# Patient Record
Sex: Male | Born: 1970 | Race: Black or African American | Hispanic: No | Marital: Married
Health system: Midwestern US, Community
[De-identification: ages and names within clinical notes are randomized; demographics above are authoritative.]

## PROBLEM LIST (undated history)

## (undated) DIAGNOSIS — E785 Hyperlipidemia, unspecified: Secondary | ICD-10-CM

## (undated) DIAGNOSIS — C801 Malignant (primary) neoplasm, unspecified: Secondary | ICD-10-CM

## (undated) DIAGNOSIS — E119 Type 2 diabetes mellitus without complications: Secondary | ICD-10-CM

## (undated) DIAGNOSIS — R972 Elevated prostate specific antigen [PSA]: Secondary | ICD-10-CM

## (undated) DIAGNOSIS — T7840XA Allergy, unspecified, initial encounter: Secondary | ICD-10-CM

## (undated) DIAGNOSIS — I10 Essential (primary) hypertension: Secondary | ICD-10-CM

## (undated) DIAGNOSIS — B009 Herpesviral infection, unspecified: Secondary | ICD-10-CM

## (undated) HISTORY — DX: Allergy, unspecified, initial encounter: T78.40XA

## (undated) HISTORY — PX: OTHER SURGICAL HISTORY: SHX169

## (undated) HISTORY — DX: Type 2 diabetes mellitus without complications: E11.9

## (undated) HISTORY — PX: COLONOSCOPY: SHX174

## (undated) HISTORY — DX: Herpesviral infection, unspecified: B00.9

## (undated) HISTORY — DX: Hyperlipidemia, unspecified: E78.5

## (undated) HISTORY — PX: PROSTATE BIOPSY: SHX241

## (undated) HISTORY — DX: Essential (primary) hypertension: I10

---

## 2001-03-12 ENCOUNTER — Emergency Department (HOSPITAL_COMMUNITY): Admission: EM | Admit: 2001-03-12 | Discharge: 2001-03-12 | Payer: Self-pay | Admitting: Emergency Medicine

## 2002-03-05 ENCOUNTER — Encounter: Admission: RE | Admit: 2002-03-05 | Discharge: 2002-03-05 | Payer: Self-pay | Admitting: Emergency Medicine

## 2002-03-05 ENCOUNTER — Encounter: Payer: Self-pay | Admitting: Emergency Medicine

## 2008-09-15 ENCOUNTER — Emergency Department (HOSPITAL_COMMUNITY): Admission: EM | Admit: 2008-09-15 | Discharge: 2008-09-15 | Payer: Self-pay | Admitting: Emergency Medicine

## 2011-06-06 ENCOUNTER — Emergency Department (HOSPITAL_COMMUNITY)
Admission: EM | Admit: 2011-06-06 | Discharge: 2011-06-06 | Disposition: A | Discharge status: Home / Self Care | Payer: BC Managed Care – PPO | Attending: Emergency Medicine | Admitting: Emergency Medicine

## 2011-06-06 ENCOUNTER — Emergency Department (HOSPITAL_COMMUNITY): Payer: BC Managed Care – PPO

## 2011-06-06 DIAGNOSIS — K5900 Constipation, unspecified: Secondary | ICD-10-CM | POA: Insufficient documentation

## 2011-06-06 DIAGNOSIS — R109 Unspecified abdominal pain: Secondary | ICD-10-CM | POA: Insufficient documentation

## 2011-09-24 LAB — URINALYSIS, ROUTINE W REFLEX MICROSCOPIC
Glucose, UA: NEGATIVE
Protein, ur: NEGATIVE

## 2011-09-24 LAB — POCT I-STAT, CHEM 8
BUN: 8
Calcium, Ion: 1.1 — ABNORMAL LOW
Chloride: 106
Creatinine, Ser: 1.2
Glucose, Bld: 80
HCT: 46
Hemoglobin: 15.6
Potassium: 3.6
Sodium: 138
TCO2: 26

## 2011-09-24 LAB — COMPREHENSIVE METABOLIC PANEL
ALT: 43
AST: 51 — ABNORMAL HIGH
Albumin: 4.3
Alkaline Phosphatase: 67
Chloride: 105
GFR calc Af Amer: >60
Potassium: 4.1
Sodium: 138
Total Bilirubin: 1.7 — ABNORMAL HIGH

## 2011-09-24 LAB — DIFFERENTIAL
Basophils Absolute: 0
Basophils Relative: 0
Eosinophils Absolute: 0.1
Eosinophils Relative: 2
Lymphocytes Relative: 40
Lymphs Abs: 2.2
Monocytes Absolute: 0.4
Monocytes Relative: 7
Neutro Abs: 2.8
Neutrophils Relative %: 51

## 2011-09-24 LAB — CBC
HCT: 46.5
Hemoglobin: 16.1
MCHC: 34.6
MCV: 86
Platelets: 277
RBC: 5.4
RDW: 13.1
WBC: 5.5

## 2011-09-24 LAB — POCT CARDIAC MARKERS: Myoglobin, poc: 66.8

## 2012-09-09 LAB — HM COLONOSCOPY: HM Colonoscopy: NORMAL

## 2013-03-30 LAB — LIPID PANEL
Cholesterol: 206 mg/dL — AB (ref 0–200)
HDL: 39 mg/dL (ref 35–70)
LDL CALC: 126 mg/dL
TRIGLYCERIDES: 259 mg/dL — AB (ref 40–160)

## 2013-03-30 LAB — CBC AND DIFFERENTIAL: HEMOGLOBIN: 16.5 g/dL (ref 13.5–17.5)

## 2013-03-30 LAB — BASIC METABOLIC PANEL
BUN: 9 mg/dL (ref 4–21)
CREATININE: 0.9 mg/dL (ref <=1.3)
Glucose: 86 mg/dL
POTASSIUM: 3.8 mmol/L (ref 3.4–5.3)
Sodium: 138 mmol/L (ref 137–147)

## 2014-07-11 LAB — CBC WITH AUTOMATED DIFF
ABS. BASOPHILS: 0 10*3/uL (ref 0.0–0.06)
ABS. EOSINOPHILS: 0.2 10*3/uL (ref 0.0–0.4)
ABS. LYMPHOCYTES: 2.4 10*3/uL (ref 0.9–3.6)
ABS. MONOCYTES: 0.4 10*3/uL (ref 0.05–1.2)
ABS. NEUTROPHILS: 1.9 10*3/uL (ref 1.8–8.0)
BASOPHILS: 0 % (ref 0–2)
EOSINOPHILS: 3 % (ref 0–5)
HCT: 45.2 % (ref 36.0–48.0)
HGB: 16.4 g/dL — ABNORMAL HIGH (ref 13.0–16.0)
LYMPHOCYTES: 49 % (ref 21–52)
MCH: 29.7 PG (ref 24.0–34.0)
MCHC: 36.3 g/dL (ref 31.0–37.0)
MCV: 81.9 FL (ref 74.0–97.0)
MONOCYTES: 9 % (ref 3–10)
MPV: 10.6 FL (ref 9.2–11.8)
NEUTROPHILS: 39 % — ABNORMAL LOW (ref 40–73)
PLATELET: 241 10*3/uL (ref 135–420)
RBC: 5.52 M/uL — ABNORMAL HIGH (ref 4.70–5.50)
RDW: 12.7 % (ref 11.6–14.5)
WBC: 4.8 10*3/uL (ref 4.6–13.2)

## 2014-07-11 LAB — EKG, 12 LEAD, INITIAL
Atrial Rate: 75 {beats}/min
Calculated P Axis: 53 degrees
Calculated R Axis: 1 degrees
Calculated T Axis: 5 degrees
P-R Interval: 210 ms
Q-T Interval: 402 ms
QRS Duration: 112 ms
QTC Calculation (Bezet): 448 ms
Ventricular Rate: 75 {beats}/min

## 2014-07-11 LAB — URINALYSIS W/ RFLX MICROSCOPIC
Bilirubin: NEGATIVE
Blood: NEGATIVE
Glucose: NEGATIVE mg/dL
Ketone: NEGATIVE mg/dL
Leukocyte Esterase: NEGATIVE
Nitrites: NEGATIVE
Protein: NEGATIVE mg/dL
Specific gravity: 1.015 (ref 1.003–1.030)
Urobilinogen: 0.2 EU/dL (ref 0.2–1.0)
pH (UA): 6 (ref 5.0–8.0)

## 2014-07-11 LAB — METABOLIC PANEL, BASIC
Anion gap: 7 mmol/L (ref 3.0–18)
BUN/Creatinine ratio: 11 — ABNORMAL LOW (ref 12–20)
BUN: 12 MG/DL (ref 7.0–18)
CO2: 29 mmol/L (ref 21–32)
Calcium: 8.9 MG/DL (ref 8.5–10.1)
Chloride: 103 mmol/L (ref 100–108)
Creatinine: 1.09 MG/DL (ref 0.6–1.3)
GFR est AA: >60 mL/min/{1.73_m2} (ref >60)
GFR est non-AA: >60 mL/min/{1.73_m2} (ref >60)
Glucose: 118 mg/dL — ABNORMAL HIGH (ref 74–99)
Potassium: 4.1 mmol/L (ref 3.5–5.5)
Sodium: 139 mmol/L (ref 136–145)

## 2014-07-11 LAB — GLUCOSE, POC: Glucose (POC): 95 mg/dL (ref 70–110)

## 2014-07-11 MED ADMIN — sodium chloride 0.9 % bolus infusion 1,000 mL: INTRAVENOUS | @ 18:00:00 | NDC 00409798309

## 2014-07-11 MED FILL — SODIUM CHLORIDE 0.9 % IV: INTRAVENOUS | Qty: 1000

## 2014-07-11 NOTE — ED Notes (Signed)
Stood up light headed in parking lot. Took wifes lisinopril because he didn't have hismed

## 2014-07-11 NOTE — ED Notes (Signed)
Pt states he did not feel any dizziness during orthostatic BP checks.

## 2014-07-11 NOTE — ED Provider Notes (Signed)
HPI Comments: Larry Baird is a 43 y.o. male with a history of HTN, who presents to the ED c/o lightheadedness and near syncope which began about 20 minutes PTA. Patient states that he started feeling lightheaded and dizzy upon getting out of the car while walking into a restaurant. He describes his diziness as "a heavy head" with associated lightheadedness and "my vision started to go dark and I felt like I might pass out". Patient denies "room-spinning" dizziness, syncope, headache, gait problems, numbness, weakness nausea, and vomiting. He states that he has been on vacation this weekend and spent all day yesterday on the beach, "drinking a few beers, eating out and having a good time". No other symptoms or complaints were presented at this time.      The history is provided by the patient.        Past Medical History   Diagnosis Date   ??? Hypertension         History reviewed. No pertinent past surgical history.      History reviewed. No pertinent family history.     History     Social History   ??? Marital Status: N/A     Spouse Name: N/A     Number of Children: N/A   ??? Years of Education: N/A     Occupational History   ??? Not on file.     Social History Main Topics   ??? Smoking status: Never Smoker    ??? Smokeless tobacco: Not on file   ??? Alcohol Use: No   ??? Drug Use: Not on file   ??? Sexual Activity: Not on file     Other Topics Concern   ??? Not on file     Social History Narrative   ??? No narrative on file                  ALLERGIES: Review of patient's allergies indicates no known allergies.      Review of Systems   Constitutional: Negative.    HENT: Negative.    Eyes: Positive for visual disturbance (blurred, associated with lightheadedness).   Respiratory: Negative.    Cardiovascular: Negative.    Gastrointestinal: Negative.  Negative for nausea and vomiting.   Endocrine: Negative.    Genitourinary: Negative.    Musculoskeletal: Negative.  Negative for gait problem.   Skin: Negative.     Allergic/Immunologic: Negative.    Neurological: Positive for dizziness and light-headedness. Negative for syncope and headaches.   Hematological: Negative.    Psychiatric/Behavioral: Negative.    All other systems reviewed and are negative.      Filed Vitals:    07/11/14 1237   BP: 146/92   Pulse: 77   Temp: 97.4 ??F (36.3 ??C)   Resp: 17   Height: 5\' 9"  (1.753 m)   Weight: 92.987 kg (205 lb)   SpO2: 100%            Physical Exam   Constitutional: He is oriented to person, place, and time. He appears well-developed and well-nourished. No distress.   Resting comfortably on stretcher   HENT:   Head: Normocephalic and atraumatic.   Mouth/Throat: Oropharynx is clear and moist. No oropharyngeal exudate.   MM moist   Eyes: Conjunctivae and EOM are normal. Pupils are equal, round, and reactive to light. No scleral icterus.   Sclera clear bilaterally   Neck: Normal range of motion. Neck supple. No JVD present. No thyromegaly present.   Non-tender to palpation.  No carotid bruits auscultated.   Cardiovascular: Normal rate, regular rhythm and normal heart sounds.  Exam reveals no gallop and no friction rub.    No murmur heard.  Pulmonary/Chest: Effort normal and breath sounds normal. No respiratory distress. He has no wheezes. He has no rales. He exhibits no tenderness.   No crepitance with palpation   Abdominal: Soft. He exhibits no distension. There is no tenderness.   Genitourinary:   No CVA tenderness   Musculoskeletal: He exhibits no edema or tenderness.   Normal inspection of upper extremities.  No edema noted to bilateral lower extremities   Lymphadenopathy:     He has no cervical adenopathy.   Neurological: He is alert and oriented to person, place, and time. No cranial nerve deficit. He exhibits normal muscle tone.   Normal motor and sensation bilaterally to upper and lower extremities   Skin: Skin is warm and dry. No rash noted. He is not diaphoretic.   Psychiatric:   Normal mood and affect.     Vitals reviewed.        MDM  Number of Diagnoses or Management Options  Diagnosis management comments: Pt with lightheadedness and near syncope upon standing after weekend on the beach.  Some drinking yesterday.  Non-focal neuro exam and no associated cardiopulmonary symptoms.  Will check labs and EKG, orthostatics but suspect relative intravascular volume depletion.  Reassess.    Pt states he is feeling much better.  Has ambulated around ED without dizziness or difficulty.  Will discharge to home. Advised rest, PO fluids, avoid heat and follow up with PMD in [place] when he returns home today.  Pt and family in agreement with discharge plans.       Amount and/or Complexity of Data Reviewed  Clinical lab tests: ordered and reviewed  Tests in the radiology section of CPT??: reviewed and ordered  Tests in the medicine section of CPT??: ordered and reviewed  Decide to obtain previous medical records or to obtain history from someone other than the patient: yes  Obtain history from someone other than the patient: yes  Independent visualization of images, tracings, or specimens: yes    Risk of Complications, Morbidity, and/or Mortality  Presenting problems: moderate  Management options: moderate    Patient Progress  Patient progress: improved      Procedures    EKG:  NSR, rate 75, normal axis, no ST-T abnormalities.  No old EKG for comparison.    Scribe Attestation Statement:   Curt JewsKatie Shematek 07/11/2014 1:05 PM scribing for and in the presence of Dr. Rudell Cobbarty E Mouna Yager, MD     [facility]atie Shematek, Scribe      I personally performed the services described in the documentation, reviewed the documentation, as recorded by the scribe in my presence, and it accurately and completely records my words and actions.  Rudell CobbARTY E Viva Gallaher, MD

## 2014-07-11 NOTE — ED Notes (Signed)
Dr in the room to see pt.

## 2014-07-11 NOTE — ED Notes (Signed)
Patient armband removed and shredded  I have reviewed discharge instructions with the patient.  The patient verbalized understanding.  Patient discharged on no new prescriptions.

## 2014-07-11 NOTE — ED Notes (Signed)
Pt a/ox4, plan of care discussed and questions encouraged. Will continue to monitor and support. Call bell within reach.

## 2014-07-11 NOTE — ED Notes (Addendum)
Pt states he is feeling dizzy since he took his wife's lisinopril this morning. Pt denies cp, sob, HA, numbness, weakness, N/V/D, abdominal pain and any changes in bowel and bladder habits.

## 2014-09-09 ENCOUNTER — Encounter: Payer: Self-pay | Admitting: Family Medicine

## 2014-09-09 DIAGNOSIS — A6000 Herpesviral infection of urogenital system, unspecified: Secondary | ICD-10-CM | POA: Insufficient documentation

## 2014-09-09 DIAGNOSIS — I10 Essential (primary) hypertension: Secondary | ICD-10-CM | POA: Insufficient documentation

## 2014-09-09 DIAGNOSIS — B009 Herpesviral infection, unspecified: Secondary | ICD-10-CM

## 2014-09-09 DIAGNOSIS — I159 Secondary hypertension, unspecified: Secondary | ICD-10-CM

## 2014-09-30 ENCOUNTER — Encounter: Payer: Self-pay | Admitting: Family Medicine

## 2014-09-30 ENCOUNTER — Ambulatory Visit (INDEPENDENT_AMBULATORY_CARE_PROVIDER_SITE_OTHER): Payer: BC Managed Care – PPO | Admitting: Family Medicine

## 2014-09-30 VITALS — BP 130/98 | HR 80 | Temp 98.2°F | Ht 69.0 in | Wt 214.0 lb

## 2014-09-30 DIAGNOSIS — B009 Herpesviral infection, unspecified: Secondary | ICD-10-CM

## 2014-09-30 DIAGNOSIS — I10 Essential (primary) hypertension: Secondary | ICD-10-CM

## 2014-09-30 MED ORDER — LISINOPRIL 20 MG PO TABS: 20.0000 mg | ORAL_TABLET | Freq: Every day | ORAL | Status: DC

## 2014-09-30 MED ORDER — VALACYCLOVIR HCL 500 MG PO TABS: 500.0000 mg | ORAL_TABLET | Freq: Two times a day (BID) | ORAL | Status: DC

## 2014-09-30 NOTE — Assessment & Plan Note (Signed)
Poor control. Increase lisinopril to 20mg  from 5mg . Start DASH diet and continue recent start of exercise.

## 2014-09-30 NOTE — Assessment & Plan Note (Signed)
Refilled valtrex for prn use  

## 2014-09-30 NOTE — Patient Instructions (Addendum)
Flu shot today  Stop at front desk with medical records wo we can get the following: Records-immunizations, labs for last 5 years, colonoscopy report, last physical exam  Blood pressure is elevated on the bottom number. I am increasing your lisinopril to 20mg . We will get labs to make sure kidney function was ok through your records.   You set a weight goal of 180-190. This should help with your blood pressure. Keep up the exercise and follow the eating plan below to help you get to blood pressure goal without more medicine.   DASH Eating Plan DASH stands for "Dietary Approaches to Stop Hypertension." The DASH eating plan is a healthy eating plan that has been shown to reduce high blood pressure (hypertension). Additional health benefits may include reducing the risk of type 2 diabetes mellitus, heart disease, and stroke. The DASH eating plan may also help with weight loss. WHAT DO I NEED TO KNOW ABOUT THE DASH EATING PLAN? For the DASH eating plan, you will follow these general guidelines:  Choose foods with a percent daily value for sodium of less than 5% (as listed on the food label).  Use salt-free seasonings or herbs instead of table salt or sea salt.  Check with your health care provider or pharmacist before using salt substitutes.  Eat lower-sodium products, often labeled as "lower sodium" or "no salt added."  Eat fresh foods.  Eat more vegetables, fruits, and low-fat dairy products.  Choose whole grains. Look for the word "whole" as the first word in the ingredient list.  Choose fish and skinless chicken or [place]key more often than red meat. Limit fish, poultry, and meat to 6 oz (170 g) each day.  Limit sweets, desserts, sugars, and sugary drinks.  Choose heart-healthy fats.  Limit cheese to 1 oz (28 g) per day.  Eat more home-cooked food and less restaurant, buffet, and fast food.  Limit fried foods.  Cook foods using methods other than frying.  Limit canned  vegetables. If you do use them, rinse them well to decrease the sodium.  When eating at a restaurant, ask that your food be prepared with less salt, or no salt if possible. WHAT FOODS CAN I EAT? Seek help from a dietitian for individual calorie needs. Grains Whole grain or whole wheat bread. Brown rice. Whole grain or whole wheat pasta. Quinoa, bulgur, and whole grain cereals. Low-sodium cereals. Corn or whole wheat flour tortillas. Whole grain cornbread. Whole grain crackers. Low-sodium crackers. Vegetables Fresh or frozen vegetables (raw, steamed, roasted, or grilled). Low-sodium or reduced-sodium tomato and vegetable juices. Low-sodium or reduced-sodium tomato sauce and paste. Low-sodium or reduced-sodium canned vegetables.  Fruits All fresh, canned (in natural juice), or frozen fruits. Meat and Other Protein Products Ground beef (85% or leaner), grass-fed beef, or beef trimmed of fat. Skinless chicken or [place]key. Ground chicken or [place]key. Pork trimmed of fat. All fish and seafood. Eggs. Dried beans, peas, or lentils. Unsalted nuts and seeds. Unsalted canned beans. Dairy Low-fat dairy products, such as skim or 1% milk, 2% or reduced-fat cheeses, low-fat ricotta or cottage cheese, or plain low-fat yogurt. Low-sodium or reduced-sodium cheeses. Fats and Oils Tub margarines without trans fats. Light or reduced-fat mayonnaise and salad dressings (reduced sodium). Avocado. Safflower, olive, or canola oils. Natural peanut or almond butter. Other Unsalted popcorn and pretzels. The items listed above may not be a complete list of recommended foods or beverages. Contact your dietitian for more options. WHAT FOODS ARE NOT RECOMMENDED? Grains White bread. White  pasta. White rice. Refined cornbread. Bagels and croissants. Crackers that contain trans fat. Vegetables Creamed or fried vegetables. Vegetables in a cheese sauce. Regular canned vegetables. Regular canned tomato sauce and paste. Regular tomato  and vegetable juices. Fruits Dried fruits. Canned fruit in light or heavy syrup. Fruit juice. Meat and Other Protein Products Fatty cuts of meat. Ribs, chicken wings, bacon, sausage, bologna, salami, chitterlings, fatback, hot dogs, bratwurst, and packaged luncheon meats. Salted nuts and seeds. Canned beans with salt. Dairy Whole or 2% milk, cream, half-and-half, and cream cheese. Whole-fat or sweetened yogurt. Full-fat cheeses or blue cheese. Nondairy creamers and whipped toppings. Processed cheese, cheese spreads, or cheese curds. Condiments Onion and garlic salt, seasoned salt, table salt, and sea salt. Canned and packaged gravies. Worcestershire sauce. Tartar sauce. Barbecue sauce. Teriyaki sauce. Soy sauce, including reduced sodium. Steak sauce. Fish sauce. Oyster sauce. Cocktail sauce. Horseradish. Ketchup and mustard. Meat flavorings and tenderizers. Bouillon cubes. Hot sauce. Tabasco sauce. Marinades. Taco seasonings. Relishes. Fats and Oils Butter, stick margarine, lard, shortening, ghee, and bacon fat. Coconut, palm kernel, or palm oils. Regular salad dressings. Other Pickles and olives. Salted popcorn and pretzels. The items listed above may not be a complete list of foods and beverages to avoid. Contact your dietitian for more information. WHERE CAN I FIND MORE INFORMATION? National Heart, Lung, and Blood Institute: [employer] Document Released: 11/29/2011 Document Revised: 04/26/2014 Document Reviewed: 10/14/2013 [facility] Patient Information 2015 [place], [place]. This information is not intended to replace advice given to you by your health care provider. Make sure you discuss any questions you have with your health care provider.

## 2014-09-30 NOTE — Progress Notes (Signed)
Tana Conch, MD Phone: [phone]  Subjective:  Patient presents today to establish care. Chief complaint-noted.   Hypertension-poor control  BP Readings from Last 3 Encounters:  09/30/14 130/98  Started working out about a week ago. Cardio 20-30 minutes on elliptical or treadmill and then weight bench trying to do 3 days a week.  Foods-does eat out a lot, tea 2x a week Taking lisinopril 5mg  Compliant with medications-yes without side effects ROS-Denies any CP, HA, SOB, blurry vision, LE edema.   Genital Herpes- well controlled Valtrex once per year for flares but no suppressive use.  ROS-no current liesions noted  The following were reviewed and entered/updated in epic: Past Medical History  Diagnosis Date  . Hypertension   . Herpes    Patient Active Problem List   Diagnosis Date Noted  . Hypertension 09/09/2014  . Herpes 09/09/2014   Past Surgical History  Procedure Laterality Date  . None      Family History  Problem Relation Age of Onset  . Diabetes Mother   . Kidney disease Brother     transplant    Medications- reviewed and updated Current Outpatient Prescriptions  Medication Sig Dispense Refill  . lisinopril (PRINIVIL,ZESTRIL) 20 MG tablet Take 1 tablet (20 mg total) by mouth daily.  90 tablet  3  . valACYclovir (VALTREX) 500 MG tablet Take 1 tablet (500 mg total) by mouth 2 (two) times daily.  6 tablet  5   No current facility-administered medications for this visit.    Allergies-reviewed and updated No Known Allergies  History   Social History  . Marital Status: Married    Spouse Name: N/A    Number of Children: N/A  . Years of Education: N/A   Social History Main Topics  . Smoking status: Never Smoker   . Smokeless tobacco: Not on file  . Alcohol Use: 0.5 oz/week    1 drink(s) per week     Comment: max 3.   . Drug Use: No  . Sexual Activity: Yes   Other Topics Concern  . Not on file   Social History Narrative   Married 2005  to [employer]. 1 son who is at [employer], [employer].       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    BA from [employer].       Hobbies: gamer-[employer] of [employer], [employer], [employer] 2    ROS--See HPI , otherwise full ROS was completed and negative except as noted above  Objective: BP 130/98  Pulse 80  Temp(Src) 98.2 F (36.8 C)  Ht 5\' 9"  (1.753 m)  Wt 214 lb (97.07 kg)  BMI 31.59 kg/m2 Gen: NAD, resting comfortably on table HEENT: Mucous membranes are moist. Oropharynx normal. Tm normal . EYEs: no lid lag, sclera and conjunctivae normal Neck: no thyromegaly CV: RRR no murmurs rubs or gallops Lungs: CTAB no crackles, wheeze, rhonchi Abdomen: soft/nontender/nondistended/normal bowel sounds. No rebound or guarding.  Ext: no edema, 2+ PT pulses Skin: warm, dry, no rash Neuro: grossly normal, moves all extremities, PERRLA   Assessment/Plan:  Obtain Records-immunizations, labs for last 5 years, colonoscopy report, last physical exam  Hypertension Poor control. Increase lisinopril to 20mg  from 5mg . Start DASH diet and continue recent start of exercise.   Herpes Refilled valtrex for prn use  f/u 6 months-patient to work on weight/diet/exercise in hopes it helps BP  Meds ordered this encounter  Medications  . DISCONTD: lisinopril (PRINIVIL,ZESTRIL) 5 MG tablet    Sig: Take 5  mg by mouth daily.  . valACYclovir (VALTREX) 500 MG tablet    Sig: Take 1 tablet (500 mg total) by mouth 2 (two) times daily.    Dispense:  6 tablet    Refill:  5  . lisinopril (PRINIVIL,ZESTRIL) 20 MG tablet    Sig: Take 1 tablet (20 mg total) by mouth daily.    Dispense:  90 tablet    Refill:  3

## 2015-04-01 ENCOUNTER — Ambulatory Visit: Payer: BC Managed Care – PPO | Admitting: Family Medicine

## 2015-04-13 ENCOUNTER — Emergency Department (HOSPITAL_BASED_OUTPATIENT_CLINIC_OR_DEPARTMENT_OTHER)
Admission: EM | Admit: 2015-04-13 | Discharge: 2015-04-14 | Disposition: A | Discharge status: Home / Self Care | Payer: BLUE CROSS/BLUE SHIELD | Attending: Emergency Medicine | Admitting: Emergency Medicine

## 2015-04-13 ENCOUNTER — Encounter (HOSPITAL_BASED_OUTPATIENT_CLINIC_OR_DEPARTMENT_OTHER): Payer: Self-pay

## 2015-04-13 ENCOUNTER — Emergency Department (HOSPITAL_BASED_OUTPATIENT_CLINIC_OR_DEPARTMENT_OTHER): Payer: BLUE CROSS/BLUE SHIELD

## 2015-04-13 DIAGNOSIS — R42 Dizziness and giddiness: Secondary | ICD-10-CM | POA: Diagnosis not present

## 2015-04-13 DIAGNOSIS — R112 Nausea with vomiting, unspecified: Secondary | ICD-10-CM | POA: Insufficient documentation

## 2015-04-13 DIAGNOSIS — I10 Essential (primary) hypertension: Secondary | ICD-10-CM | POA: Insufficient documentation

## 2015-04-13 LAB — COMPREHENSIVE METABOLIC PANEL
ALK PHOS: 55 U/L (ref 39–117)
ALT: 38 U/L (ref 0–53)
AST: 26 U/L (ref 0–37)
Albumin: 4.2 g/dL (ref 3.5–5.2)
Anion gap: 7 (ref 5–15)
BUN: 14 mg/dL (ref 6–23)
CO2: 28 mmol/L (ref 19–32)
Calcium: 8.9 mg/dL (ref 8.4–10.5)
Chloride: 104 mmol/L (ref 96–112)
Creatinine, Ser: 0.86 mg/dL (ref 0.50–1.35)
GFR calc non Af Amer: >90 mL/min (ref >90)
GLUCOSE: 144 mg/dL — AB (ref 70–99)
POTASSIUM: 4 mmol/L (ref 3.5–5.1)
SODIUM: 139 mmol/L (ref 135–145)
TOTAL PROTEIN: 7.6 g/dL (ref 6.0–8.3)
Total Bilirubin: 1.3 mg/dL — ABNORMAL HIGH (ref 0.3–1.2)

## 2015-04-13 LAB — CBC WITH DIFFERENTIAL/PLATELET
BASOS ABS: 0 10*3/uL (ref 0.0–0.1)
BASOS PCT: 0 % (ref 0–1)
Eosinophils Absolute: 0 10*3/uL (ref 0.0–0.7)
Eosinophils Relative: 1 % (ref 0–5)
HEMATOCRIT: 43.7 % (ref 39.0–52.0)
Hemoglobin: 15.6 g/dL (ref 13.0–17.0)
LYMPHS PCT: 15 % (ref 12–46)
Lymphs Abs: 1.3 10*3/uL (ref 0.7–4.0)
MCH: 29 pg (ref 26.0–34.0)
MCHC: 35.7 g/dL (ref 30.0–36.0)
MCV: 81.2 fL (ref 78.0–100.0)
Monocytes Absolute: 0.6 10*3/uL (ref 0.1–1.0)
Monocytes Relative: 7 % (ref 3–12)
NEUTROS ABS: 6.7 10*3/uL (ref 1.7–7.7)
NEUTROS PCT: 77 % (ref 43–77)
PLATELETS: 272 10*3/uL (ref 150–400)
RBC: 5.38 MIL/uL (ref 4.22–5.81)
RDW: 13.1 % (ref 11.5–15.5)
WBC: 8.7 10*3/uL (ref 4.0–10.5)

## 2015-04-13 MED ORDER — DIAZEPAM 5 MG PO TABS
5.0000 mg | ORAL_TABLET | Freq: Once | ORAL | Status: AC
  Administered 2015-04-13: 5 mg via ORAL
  Filled 2015-04-13: qty 1

## 2015-04-13 MED ORDER — LORAZEPAM 2 MG/ML IJ SOLN
1.0000 mg | Freq: Once | INTRAMUSCULAR | Status: AC
  Administered 2015-04-13: 1 mg via INTRAVENOUS
  Filled 2015-04-13: qty 1

## 2015-04-13 MED ORDER — MECLIZINE HCL 25 MG PO TABS
25.0000 mg | ORAL_TABLET | Freq: Once | ORAL | Status: AC
  Administered 2015-04-13: 25 mg via ORAL
  Filled 2015-04-13: qty 1

## 2015-04-13 MED ORDER — DIAZEPAM 5 MG/ML IJ SOLN
2.5000 mg | Freq: Once | INTRAMUSCULAR | Status: DC
  Filled 2015-04-13: qty 2

## 2015-04-13 MED ORDER — ONDANSETRON 4 MG PO TBDP
4.0000 mg | ORAL_TABLET | Freq: Once | ORAL | Status: AC
  Administered 2015-04-13: 4 mg via ORAL
  Filled 2015-04-13: qty 1

## 2015-04-13 MED ORDER — SODIUM CHLORIDE 0.9 % IV BOLUS (SEPSIS)
1000.0000 mL | Freq: Once | INTRAVENOUS | Status: AC
  Administered 2015-04-13: 1000 mL via INTRAVENOUS

## 2015-04-13 NOTE — ED Notes (Signed)
Drew up IV Valium with Vinson MoselleSam Coble, RN and got to room to find that pt had pulled out his IV and no longer has IV access.  MD notified.  VO to change order from Valium 2.5mg  IV to Valium 5mg  PO.

## 2015-04-13 NOTE — ED Notes (Signed)
Pt reports approx 1 hour ago started having dizziness described as room spinning.  Reports unable to hold anything down.  Reports vomited x4.

## 2015-04-13 NOTE — ED Notes (Signed)
Pt c/o dizziness onset 1.5 hours ago  Room spinning,  N/v, little bit of diarrhea

## 2015-04-13 NOTE — ED Provider Notes (Signed)
CSN: [id number]     Arrival date & time 04/13/15  1926 History  This chart was scribed for Tilden Fossa, MD by Phillis Haggis, ED Scribe. This patient was seen in room MH05/MH05 and patient care was started at 8:56 PM.      Chief Complaint  Patient presents with  . Dizziness   Patient is a 44 y.o. male presenting with dizziness. The history is provided by the patient.  Dizziness Quality:  Head spinning Severity:  Moderate Duration:  2 hours Timing:  Constant Progression:  Worsening Chronicity:  New Context: eye movement and head movement   Associated symptoms: nausea and vomiting   Associated symptoms: no chest pain and no weakness     HPI Comments: [name] [name] is a 44 y.o. male with a history of HTN who presents to the Emergency Department complaining of dizziness onset two hours ago. He states that quick movements make the dizziness worse. He describes the the dizziness as the room spinning. He reports associated vomiting and that he cannot keep anything down.  He denies injury or strenuous activity at the time of the incident. Patient denies fever, numbness, weakness, chest or abdominal pain. He states that he had a similar episode last summer and was told it was dehydration. He reports a family history of diabetes. Patient denies any other medical problems. He denies drinking any alcohol. Symptoms are severe, constant.  Past Medical History  Diagnosis Date  . Hypertension   . Herpes    Past Surgical History  Procedure Laterality Date  . None     Family History  Problem Relation Age of Onset  . Diabetes Mother   . Kidney disease Brother     transplant   History  Substance Use Topics  . Smoking status: Never Smoker   . Smokeless tobacco: Not on file  . Alcohol Use: 0.5 oz/week    1 drink(s) per week     Comment: max 3.     Review of Systems  Constitutional: Negative for fever.  Cardiovascular: Negative for chest pain.  Gastrointestinal: Positive for nausea  and vomiting. Negative for abdominal pain.  Neurological: Positive for dizziness. Negative for weakness and numbness.  All other systems reviewed and are negative.   Allergies  Review of patient's allergies indicates no known allergies.  Home Medications   Prior to Admission medications   Medication Sig Start Date End Date Taking? Authorizing Provider  lisinopril (PRINIVIL,ZESTRIL) 20 MG tablet Take 1 tablet (20 mg total) by mouth daily. 09/30/14   Shelva Majestic, MD  valACYclovir (VALTREX) 500 MG tablet Take 1 tablet (500 mg total) by mouth 2 (two) times daily. 09/30/14   Shelva Majestic, MD   BP 173/95 mmHg  Pulse 81  Temp(Src) 98.1 F (36.7 C) (Oral)  Resp 18  Ht  (1.727 m)  Wt 205 lb (92.987 kg)  BMI 31.18 kg/m2  SpO2 98%   Physical Exam  Constitutional: He is oriented to person, place, and time. He appears well-developed and well-nourished.  HENT:  Head: Normocephalic and atraumatic.  Eyes: Pupils are equal, round, and reactive to light.  Nystagmus on gaze to right  Cardiovascular: Normal rate and regular rhythm.   No murmur heard. Pulmonary/Chest: Effort normal and breath sounds normal. No respiratory distress.  Abdominal: Soft. There is no tenderness. There is no rebound and no guarding.  Musculoskeletal: He exhibits no edema or tenderness.  Neurological: He is alert and oriented to person, place, and time. No cranial nerve  deficit.  5/5 strength in all four extremities  Skin: Skin is warm and dry.  Psychiatric: He has a normal mood and affect. His behavior is normal.  Nursing note and vitals reviewed.   ED Course  Procedures (including critical care time) DIAGNOSTIC STUDIES: Oxygen Saturation is 98% on room air, normal by my interpretation.    COORDINATION OF CARE: 9:00 PM-Discussed treatment plan which includes fluids and labs with pt at bedside and pt agreed to plan.   Labs Review Labs Reviewed  COMPREHENSIVE METABOLIC PANEL - Abnormal; Notable for  the following:    Glucose, Bld 144 (*)    Total Bilirubin 1.3 (*)    All other components within normal limits  CBC WITH DIFFERENTIAL/PLATELET    Imaging Review Ct Head Wo Contrast  04/13/2015   CLINICAL DATA:  Dizziness, 5 hours duration.  Nausea and vomiting.  EXAM: CT HEAD WITHOUT CONTRAST  TECHNIQUE: Contiguous axial images were obtained from the base of the skull through the vertex without intravenous contrast.  COMPARISON:  None.  FINDINGS: The brain has a normal appearance without evidence of atrophy, infarction, mass lesion, hemorrhage, hydrocephalus or extra-axial collection. The calvarium is unremarkable. The paranasal sinuses, middle ears and mastoids are clear except for a very tiny amount of fluid in the mastoid air cells on the left, probably subclinical.  IMPRESSION: Normal   Electronically Signed   By: Paulina FusiMark  Shogry M.D.   On: 04/13/2015 23:24     EKG Interpretation   Date/Time:  Wednesday April 13 2015 21:23:06 EDT Ventricular Rate:  73 PR Interval:  214 QRS Duration: 112 QT Interval:  408 QTC Calculation: 449 R Axis:   50 Text Interpretation:  Sinus rhythm with 1st degree A-V block Otherwise  normal ECG Confirmed by Lincoln Brighamees, Liz [phone]([id number]) on 04/13/2015 9:26:42 PM      MDM   Final diagnoses:  Vertigo     Patient here for evaluation of vertigo. Patient persistently vertiginous in the emergency department. Vertigo does not change with position. Attempted Valium, Ativan, Zofran, meclizine without improvement in his symptoms. His vomiting has resolved in the ED. On repeat evaluation patient has persistent nystagmus that does not appear to extinguish. Plan to obtain an MRI to rule out posterior stroke given persistent vertigo. [facility] and he will see the patient in transfer.  I personally performed the services described in this documentation, which was scribed in my presence. The recorded information has been reviewed and is  accurate.    Tilden FossaElizabeth Malloree Raboin, MD 04/14/15 [phone]

## 2015-04-14 ENCOUNTER — Emergency Department (HOSPITAL_COMMUNITY): Payer: BLUE CROSS/BLUE SHIELD

## 2015-04-14 MED ORDER — MECLIZINE HCL 25 MG PO TABS: 25.0000 mg | ORAL_TABLET | Freq: Three times a day (TID) | ORAL | Status: DC | PRN

## 2015-04-14 MED ORDER — DIAZEPAM 5 MG PO TABS: 5.0000 mg | ORAL_TABLET | Freq: Three times a day (TID) | ORAL | Status: DC | PRN

## 2015-04-14 MED ORDER — ONDANSETRON 4 MG PO TBDP
8.0000 mg | ORAL_TABLET | Freq: Once | ORAL | Status: AC
  Administered 2015-04-14: 8 mg via ORAL
  Filled 2015-04-14: qty 2

## 2015-04-14 MED ORDER — SALINE SPRAY 0.65 % NA SOLN: 1.0000 | NASAL | Status: DC | PRN

## 2015-04-14 MED ORDER — SODIUM CHLORIDE 0.9 % IV BOLUS (SEPSIS)
1000.0000 mL | Freq: Once | INTRAVENOUS | Status: AC
  Administered 2015-04-14: 1000 mL via INTRAVENOUS

## 2015-04-14 MED ORDER — FLUTICASONE PROPIONATE 50 MCG/ACT NA SUSP: 1.0000 | Freq: Every day | NASAL | Status: DC

## 2015-04-14 NOTE — ED Notes (Signed)
Pt vomiting prior to leaving - verbal order for ODT zofran with readback from dr. Wilkie AyeHorton.

## 2015-04-14 NOTE — Discharge Instructions (Signed)
He presented today with dizziness. Your dizziness may be related to inner ear problems. Your MRI was negative for stroke.  You have some fluid in your in her ear as well as her sinuses. You'll be given nasal saline and Flonase in an attempt to drain your congestion. No indication for an approximate this time. Follow-up with her primary physician.  Dizziness Dizziness is a common problem. It is a feeling of unsteadiness or light-headedness. You may feel like you are about to faint. Dizziness can lead to injury if you stumble or fall. A person of any age group can suffer from dizziness, but dizziness is more common in older adults. CAUSES  Dizziness can be caused by many different things, including:  Middle ear problems.  Standing for too long.  Infections.  An allergic reaction.  Aging.  An emotional response to something, such as the sight of blood.  Side effects of medicines.  Tiredness.  Problems with circulation or blood pressure.  Excessive use of alcohol or medicines, or illegal drug use.  Breathing too fast (hyperventilation).  An irregular heart rhythm (arrhythmia).  A low red blood cell count (anemia).  Pregnancy.  Vomiting, diarrhea, fever, or other illnesses that cause body fluid loss (dehydration).  Diseases or conditions such as Parkinson's disease, high blood pressure (hypertension), diabetes, and thyroid problems.  Exposure to extreme heat. DIAGNOSIS  Your health care provider will ask about your symptoms, perform a physical exam, and perform an electrocardiogram (ECG) to record the electrical activity of your heart. Your health care provider may also perform other heart or blood tests to determine the cause of your dizziness. These may include:  Transthoracic echocardiogram (TTE). During echocardiography, sound waves are used to evaluate how blood flows through your heart.  Transesophageal echocardiogram (TEE).  Cardiac monitoring. This allows your health  care provider to monitor your heart rate and rhythm in real time.  Holter monitor. This is a portable device that records your heartbeat and can help diagnose heart arrhythmias. It allows your health care provider to track your heart activity for several days if needed.  Stress tests by exercise or by giving medicine that makes the heart beat faster. TREATMENT  Treatment of dizziness depends on the cause of your symptoms and can vary greatly. HOME CARE INSTRUCTIONS   Drink enough fluids to keep your urine clear or pale yellow. This is especially important in very hot weather. In older adults, it is also important in cold weather.  Take your medicine exactly as directed if your dizziness is caused by medicines. When taking blood pressure medicines, it is especially important to get up slowly.  Rise slowly from chairs and steady yourself until you feel okay.  In the morning, first sit up on the side of the bed. When you feel okay, stand slowly while holding onto something until you know your balance is fine.  Move your legs often if you need to stand in one place for a long time. Tighten and relax your muscles in your legs while standing.  Have someone stay with you for 1-2 days if dizziness continues to be a problem. Do this until you feel you are well enough to stay alone. Have the person call your health care provider if he or she notices changes in you that are concerning.  Do not drive or use heavy machinery if you feel dizzy.  Do not drink alcohol. SEEK IMMEDIATE MEDICAL CARE IF:   Your dizziness or light-headedness gets worse.  You feel  nauseous or vomit.  You have problems talking, walking, or using your arms, hands, or legs.  You feel weak.  You are not thinking clearly or you have trouble forming sentences. It may take a friend or family member to notice this.  You have chest pain, abdominal pain, shortness of breath, or sweating.  Your vision changes.  You notice any  bleeding.  You have side effects from medicine that seems to be getting worse rather than better. MAKE SURE YOU:   Understand these instructions.  Will watch your condition.  Will get help right away if you are not doing well or get worse. Document Released: 06/05/2001 Document Revised: 12/15/2013 Document Reviewed: 06/29/2011 [facility] Patient Information 2015 [place], [place]. This information is not intended to replace advice given to you by your health care provider. Make sure you discuss any questions you have with your health care provider.

## 2015-04-14 NOTE — ED Provider Notes (Signed)
Patient transferred from [facility] with vertiginous symptoms. Concern for stroke given her refractory symptoms following treatment. Transferred for MRI.  On my assessment, patient continues to endorse dizziness. Otherwise nontoxic.  MRI obtained and negative for stroke. On recheck, patient reports improvement of symptoms. MRI did show air in the mastoids bilaterally as well as the frontal sinus. Patient denies any fevers or sinus symptoms. Discussed with patient use of Flonase and nasal saline to try to clear out the fluid. No indication bran about X at this time. Continued supportive management home. Patient was able to ambulate and tolerate fluids.  After history, exam, and medical workup I feel the patient has been appropriately medically screened and is safe for discharge home. Pertinent diagnoses were discussed with the patient. Patient was given return precautions.   Results for orders placed or performed during the hospital encounter of 04/13/15  Comprehensive metabolic panel  Result Value Ref Range   Sodium 139 135 - 145 mmol/L   Potassium 4.0 3.5 - 5.1 mmol/L   Chloride 104 96 - 112 mmol/L   CO2 28 19 - 32 mmol/L   Glucose, Bld 144 (H) 70 - 99 mg/dL   BUN 14 6 - 23 mg/dL   Creatinine, Ser 1.610.86 0.50 - 1.35 mg/dL   Calcium 8.9 8.4 - 09.610.5 mg/dL   Total Protein 7.6 6.0 - 8.3 g/dL   Albumin 4.2 3.5 - 5.2 g/dL   AST 26 0 - 37 U/L   ALT 38 0 - 53 U/L   Alkaline Phosphatase 55 39 - 117 U/L   Total Bilirubin 1.3 (H) 0.3 - 1.2 mg/dL   GFR calc non Af Amer >90 >90 mL/min   GFR calc Af Amer >90 >90 mL/min   Anion gap 7 5 - 15  CBC with Differential  Result Value Ref Range   WBC 8.7 4.0 - 10.5 K/uL   RBC 5.38 4.22 - 5.81 MIL/uL   Hemoglobin 15.6 13.0 - 17.0 g/dL   HCT 04.543.7 40.939.0 - 81.152.0 %   MCV 81.2 78.0 - 100.0 fL   MCH 29.0 26.0 - 34.0 pg   MCHC 35.7 30.0 - 36.0 g/dL   RDW 91.413.1 78.211.5 - 95.615.5 %   Platelets 272 150 - 400 K/uL   Neutrophils Relative % 77 43 - 77 %   Neutro Abs  6.7 1.7 - 7.7 K/uL   Lymphocytes Relative 15 12 - 46 %   Lymphs Abs 1.3 0.7 - 4.0 K/uL   Monocytes Relative 7 3 - 12 %   Monocytes Absolute 0.6 0.1 - 1.0 K/uL   Eosinophils Relative 1 0 - 5 %   Eosinophils Absolute 0.0 0.0 - 0.7 K/uL   Basophils Relative 0 0 - 1 %   Basophils Absolute 0.0 0.0 - 0.1 K/uL   Ct Head Wo Contrast  04/13/2015   CLINICAL DATA:  Dizziness, 5 hours duration.  Nausea and vomiting.  EXAM: CT HEAD WITHOUT CONTRAST  TECHNIQUE: Contiguous axial images were obtained from the base of the skull through the vertex without intravenous contrast.  COMPARISON:  None.  FINDINGS: The brain has a normal appearance without evidence of atrophy, infarction, mass lesion, hemorrhage, hydrocephalus or extra-axial collection. The calvarium is unremarkable. The paranasal sinuses, middle ears and mastoids are clear except for a very tiny amount of fluid in the mastoid air cells on the left, probably subclinical.  IMPRESSION: Normal   Electronically Signed   By: Paulina FusiMark  Shogry M.D.   On: 04/13/2015 23:24  Mr Brain Wo Contrast  04/14/2015   CLINICAL DATA:  Dizziness for 2 hours, worse with movement. Vomiting. Similar symptoms last year attributed to dehydration. History of hypertension.  EXAM: MRI HEAD WITHOUT CONTRAST  TECHNIQUE: Multiplanar, multiecho pulse sequences of the brain and surrounding structures were obtained without intravenous contrast.  COMPARISON:  CT of the head April 13, 2015  FINDINGS: The ventricles and sulci are normal for patient's age. No abnormal parenchymal signal, mass lesions, mass effect. No reduced diffusion to suggest acute ischemia. No susceptibility artifact to suggest hemorrhage.  No abnormal extra-axial fluid collections. No extra-axial masses though, contrast enhanced sequences would be more sensitive. Normal major intracranial vascular flow voids seen at the skull base.  Ocular globes and orbital contents are unremarkable though not tailored for evaluation. No abnormal  sellar expansion. Mild paranasal sinus mucosal thickening. Small LEFT greater than RIGHT mastoid effusions. No suspicious calvarial bone marrow signal. No abnormal sellar expansion. Craniocervical junction maintained.  IMPRESSION: No acute intracranial process; normal noncontrast MRI of the brain.  Mild paranasal sinusitis with small mastoid effusions.   Electronically Signed   By: Awilda Metro   On: 04/14/2015 03:25      Shon Baton, MD 04/14/15 0500

## 2015-04-14 NOTE — ED Notes (Addendum)
PT from [facility]: Per [facility] - Pt has been dizzy, dizziness not relieved with medication. Pt unable to stand due to dizziness. Pt sent her for MRI

## 2015-04-14 NOTE — ED Notes (Signed)
Ok per EDP for pt to have PO food/fluids. Pt given water and crackers.

## 2015-04-20 ENCOUNTER — Telehealth: Payer: Self-pay | Admitting: Family Medicine

## 2015-04-20 NOTE — Telephone Encounter (Signed)
See below

## 2015-04-20 NOTE — Telephone Encounter (Signed)
Yes may use SDA

## 2015-04-20 NOTE — Telephone Encounter (Signed)
Pt was seen in the ER for dizzyness and a couple of other issues. He need a ER fup appt and he has an appt on 05/13/15. Dr Durene CalHunter only has a couple of SDA does he need to see the pt before 05/13/15 and if so do I use SDA

## 2015-05-13 ENCOUNTER — Encounter: Payer: Self-pay | Admitting: Family Medicine

## 2015-05-13 ENCOUNTER — Ambulatory Visit (INDEPENDENT_AMBULATORY_CARE_PROVIDER_SITE_OTHER): Payer: BLUE CROSS/BLUE SHIELD | Admitting: Family Medicine

## 2015-05-13 VITALS — BP 150/102 | HR 79 | Temp 98.2°F | Ht 68.0 in | Wt 215.0 lb

## 2015-05-13 DIAGNOSIS — I10 Essential (primary) hypertension: Secondary | ICD-10-CM

## 2015-05-13 DIAGNOSIS — R42 Dizziness and giddiness: Secondary | ICD-10-CM

## 2015-05-13 MED ORDER — AMLODIPINE BESYLATE 10 MG PO TABS: 10.0000 mg | ORAL_TABLET | Freq: Every day | ORAL | Status: DC

## 2015-05-13 NOTE — Progress Notes (Signed)
Tana ConchStephen Hunter, MD  Subjective:  [name] [name] is a 44 y.o. year old very pleasant male patient who presents with:  Hypertension-Poor control off meds. Worried lisinopril caused vertigo  BP Readings from Last 3 Encounters:  05/13/15 150/102  04/14/15 152/78  09/30/14 130/98   Home BP monitoring-no Compliant with medications-no ROS-Denies any CP, HA, SOB, blurry vision, LE edema, transient weakness, orthopnea, PND.   Vertigo- now resolved -vertigo lasted a full day. Kept taking valium and meclizine but then completely resolved. No symptoms since that time. Unclear cause. Normal Brain MRI except sinusitis. Patient was concerned lisinopril was associated with episode as had something smaller but similar previously shortly after taking medication.  ROS- no extremity weakness, slurred speech, difficulty swallowing  Past Medical History- never smoker, herpes  Medications- reviewed and updated Current Outpatient Prescriptions  Medication Sig Dispense Refill  . fluticasone (FLONASE) 50 MCG/ACT nasal spray Place 1 spray into both nostrils daily. 16 g 2  . lisinopril (PRINIVIL,ZESTRIL) 20 MG tablet Take 1 tablet (20 mg total) by mouth daily. 90 tablet 3  . valACYclovir (VALTREX) 500 MG tablet Take 1 tablet (500 mg total) by mouth 2 (two) times daily. 6 tablet 5   No current facility-administered medications for this visit.    Objective: BP 150/102 mmHg  Pulse 79  Temp(Src) 98.2 F (36.8 C) (Oral)  Ht 5\' 8"  (1.727 m)  Wt 215 lb (97.523 kg)  BMI 32.70 kg/m2 Gen: NAD, resting comfortably CV: RRR no murmurs rubs or gallops Lungs: CTAB no crackles, wheeze, rhonchi Abdomen: soft/nontender/nondistended/normal bowel sounds. No rebound or guarding. overweight Ext: no edema Skin: warm, dry Neuro: grossly normal, moves all extremities, normal gait   Assessment/Plan:  Hypertension Lisinopril caused vertigo? Had spell lasting over a day in 03/2015 and due to persistence seen in ED and  had normal MRI of head and resolved within a day. Episode started after taking lisinopril reportedly. Will stop lisinopril and start amlodipine 10mg . See avs. F/u 2-3 weeks.   Return precautions advised.   Meds ordered this encounter  Medications  . amLODipine (NORVASC) 10 MG tablet    Sig: Take 1 tablet (10 mg total) by mouth daily.    Dispense:  90 tablet    Refill:  3

## 2015-05-13 NOTE — Patient Instructions (Signed)
Stop lisinopril  Start 1/2 tab of amlodipine for 1 week and if you do well go up to full tab. Then see me in 2-3 weeks.   Advise dash diet still to help control blood pressure  Glad the vertigo resolved.   DASH Eating Plan DASH stands for "Dietary Approaches to Stop Hypertension." The DASH eating plan is a healthy eating plan that has been shown to reduce high blood pressure (hypertension). Additional health benefits may include reducing the risk of type 2 diabetes mellitus, heart disease, and stroke. The DASH eating plan may also help with weight loss. WHAT DO I NEED TO KNOW ABOUT THE DASH EATING PLAN? For the DASH eating plan, you will follow these general guidelines:  Choose foods with a percent daily value for sodium of less than 5% (as listed on the food label).  Use salt-free seasonings or herbs instead of table salt or sea salt.  Check with your health care provider or pharmacist before using salt substitutes.  Eat lower-sodium products, often labeled as "lower sodium" or "no salt added."  Eat fresh foods.  Eat more vegetables, fruits, and low-fat dairy products.  Choose whole grains. Look for the word "whole" as the first word in the ingredient list.  Choose fish and skinless chicken or [place] more often than red meat. Limit fish, poultry, and meat to 6 oz (170 g) each day.  Limit sweets, desserts, sugars, and sugary drinks.  Choose heart-healthy fats.  Limit cheese to 1 oz (28 g) per day.  Eat more home-cooked food and less restaurant, buffet, and fast food.  Limit fried foods.  Cook foods using methods other than frying.  Limit canned vegetables. If you do use them, rinse them well to decrease the sodium.  When eating at a restaurant, ask that your food be prepared with less salt, or no salt if possible. WHAT FOODS CAN I EAT? Seek help from a dietitian for individual calorie needs. Grains Whole grain or whole wheat bread. Brown rice. Whole grain or whole wheat  pasta. Quinoa, bulgur, and whole grain cereals. Low-sodium cereals. Corn or whole wheat flour tortillas. Whole grain cornbread. Whole grain crackers. Low-sodium crackers. Vegetables Fresh or frozen vegetables (raw, steamed, roasted, or grilled). Low-sodium or reduced-sodium tomato and vegetable juices. Low-sodium or reduced-sodium tomato sauce and paste. Low-sodium or reduced-sodium canned vegetables.  Fruits All fresh, canned (in natural juice), or frozen fruits. Meat and Other Protein Products Ground beef (85% or leaner), grass-fed beef, or beef trimmed of fat. Skinless chicken or [place]. Ground chicken or [place]. Pork trimmed of fat. All fish and seafood. Eggs. Dried beans, peas, or lentils. Unsalted nuts and seeds. Unsalted canned beans. Dairy Low-fat dairy products, such as skim or 1% milk, 2% or reduced-fat cheeses, low-fat ricotta or cottage cheese, or plain low-fat yogurt. Low-sodium or reduced-sodium cheeses. Fats and Oils Tub margarines without trans fats. Light or reduced-fat mayonnaise and salad dressings (reduced sodium). Avocado. Safflower, olive, or canola oils. Natural peanut or almond butter. Other Unsalted popcorn and pretzels. The items listed above may not be a complete list of recommended foods or beverages. Contact your dietitian for more options. WHAT FOODS ARE NOT RECOMMENDED? Grains White bread. White pasta. White rice. Refined cornbread. Bagels and croissants. Crackers that contain trans fat. Vegetables Creamed or fried vegetables. Vegetables in a cheese sauce. Regular canned vegetables. Regular canned tomato sauce and paste. Regular tomato and vegetable juices. Fruits Dried fruits. Canned fruit in light or heavy syrup. Fruit juice. Meat and Other  Protein Products Fatty cuts of meat. Ribs, chicken wings, bacon, sausage, bologna, salami, chitterlings, fatback, hot dogs, bratwurst, and packaged luncheon meats. Salted nuts and seeds. Canned beans with salt. Dairy Whole  or 2% milk, cream, half-and-half, and cream cheese. Whole-fat or sweetened yogurt. Full-fat cheeses or blue cheese. Nondairy creamers and whipped toppings. Processed cheese, cheese spreads, or cheese curds. Condiments Onion and garlic salt, seasoned salt, table salt, and sea salt. Canned and packaged gravies. Worcestershire sauce. Tartar sauce. Barbecue sauce. Teriyaki sauce. Soy sauce, including reduced sodium. Steak sauce. Fish sauce. Oyster sauce. Cocktail sauce. Horseradish. Ketchup and mustard. Meat flavorings and tenderizers. Bouillon cubes. Hot sauce. Tabasco sauce. Marinades. Taco seasonings. Relishes. Fats and Oils Butter, stick margarine, lard, shortening, ghee, and bacon fat. Coconut, palm kernel, or palm oils. Regular salad dressings. Other Pickles and olives. Salted popcorn and pretzels. The items listed above may not be a complete list of foods and beverages to avoid. Contact your dietitian for more information. WHERE CAN I FIND MORE INFORMATION? National Heart, Lung, and Blood Institute: CableProm[employer].itwww.nhlbi.nih.gov/health/health-topics/topics/dash/ Document Released: 11/29/2011 Document Revised: 04/26/2014 Document Reviewed: 10/14/2013 [facility] Patient Information 2015 [place]. This information is not intended to replace advice given to you by your health care provider. Make sure you discuss any questions you have with your health care provider.

## 2015-05-13 NOTE — Progress Notes (Signed)
Pre visit review using our clinic review tool, if applicable. No additional management support is needed unless otherwise documented below in the visit note. 

## 2015-05-13 NOTE — Assessment & Plan Note (Signed)
Lisinopril caused vertigo spells? Had spell lasting over a day in 03/2015 and due to persistence seen in ED and had normal MRI of head and resolved within a day. Episode started after taking lisinopril reportedly. Will stop lisinopril and start amlodipine 10mg . See avs. F/u 2-3 weeks.

## 2015-06-08 ENCOUNTER — Encounter: Payer: Self-pay | Admitting: Family Medicine

## 2015-06-08 ENCOUNTER — Ambulatory Visit (INDEPENDENT_AMBULATORY_CARE_PROVIDER_SITE_OTHER): Payer: BLUE CROSS/BLUE SHIELD | Admitting: Family Medicine

## 2015-06-08 VITALS — BP 130/78 | HR 79 | Temp 98.4°F | Wt 216.0 lb

## 2015-06-08 DIAGNOSIS — R739 Hyperglycemia, unspecified: Secondary | ICD-10-CM | POA: Diagnosis not present

## 2015-06-08 DIAGNOSIS — L819 Disorder of pigmentation, unspecified: Secondary | ICD-10-CM

## 2015-06-08 DIAGNOSIS — I10 Essential (primary) hypertension: Secondary | ICD-10-CM

## 2015-06-08 DIAGNOSIS — Z7251 High risk heterosexual behavior: Secondary | ICD-10-CM

## 2015-06-08 LAB — HEMOGLOBIN A1C: HEMOGLOBIN A1C: 5 % (ref 4.6–6.5)

## 2015-06-08 NOTE — Patient Instructions (Signed)
Blood pressure looks much better, glad no dizziness!   I am concerned that the darkening on your neck is a sign you have increased risk for diabetes. I want to check some bloodwork to see where you lie on the diabetes spectrum.   Let's work towards a weight goal <200 with regular exercise, healthy eating. Goal exercise 150 minutes a week as long as no chest pain or abnormal shortness of breath.   Screen for HIV as recommended for any patient 18-65 1x.

## 2015-06-08 NOTE — Assessment & Plan Note (Signed)
Well controlled. Continue current meds: amlodipine 10mg 

## 2015-06-08 NOTE — Progress Notes (Signed)
Tana Conch, MD  Subjective:  [name] [name] is a 44 y.o. year old very pleasant male patient who presents with:  Hypertension-controlled on amlodipine, no vertigo/dizziness as on lisinopril  BP Readings from Last 3 Encounters:  06/08/15 130/78  05/13/15 150/102  04/14/15 152/78  Compliant with medications-yes without side effects ROS-Denies any CP, HA, SOB, blurry vision, LE edema.   Hyperglycemia -CBG in hospital 144, unclear when had eaten though. Also notes darkening on his neck ROS- no excess thirst or frequent urination  Past Medical History-  Hypertension, herpes  Medications- reviewed and updated Current Outpatient Prescriptions  Medication Sig Dispense Refill  . amLODipine (NORVASC) 10 MG tablet Take 1 tablet (10 mg total) by mouth daily. 90 tablet 3  . valACYclovir (VALTREX) 500 MG tablet Take 1 tablet (500 mg total) by mouth 2 (two) times daily. 6 tablet 5  . fluticasone (FLONASE) 50 MCG/ACT nasal spray Place 1 spray into both nostrils daily. (Patient not taking: Reported on 06/08/2015) 16 g 2   Objective: BP 130/78 mmHg  Pulse 79  Temp(Src) 98.4 F (36.9 C)  Wt 216 lb (97.977 kg) Gen: NAD, resting comfortably Neck: darkening of skin consistent with acanthosis nigricans CV: RRR no murmurs rubs or gallops Lungs: CTAB no crackles, wheeze, rhonchi Abdomen: soft/nontender/nondistended/normal bowel sounds.  Ext: no edema Skin: warm, dry, no rash Neuro: grossly normal, moves all extremities  Assessment/Plan:  Hypertension Well controlled. Continue current meds: amlodipine 10mg     Hyperglycemia - Plan: Hemoglobin A1c. Acanthosis nigricans and mother with DM. Discussed need for weight loss. If a1c not elevated, could consider derm but discussed thoguht this would be low yield.   Unprotected sex with wife- Plan: HIV antibody per screening guidelines  September follow up planned. Rerequested records from [place] today per Ms. Margaret. Hold on tdap until  records.   Orders Placed This Encounter  Procedures  . Hemoglobin A1c    [place]  . HIV antibody    [facility]

## 2015-06-09 ENCOUNTER — Encounter: Payer: Self-pay | Admitting: Family Medicine

## 2015-06-09 LAB — HIV ANTIBODY (ROUTINE TESTING W REFLEX): HIV: NONREACTIVE

## 2015-06-10 NOTE — Addendum Note (Signed)
Addended by: Lieutenant Diego A on: 06/10/2015 02:55 PM   Modules accepted: Orders

## 2015-09-09 ENCOUNTER — Other Ambulatory Visit: Payer: Self-pay

## 2015-09-14 ENCOUNTER — Other Ambulatory Visit (INDEPENDENT_AMBULATORY_CARE_PROVIDER_SITE_OTHER): Payer: Managed Care, Other (non HMO)

## 2015-09-14 DIAGNOSIS — Z0000 Encounter for general adult medical examination without abnormal findings: Secondary | ICD-10-CM

## 2015-09-14 LAB — POCT URINALYSIS DIPSTICK
Bilirubin, UA: NEGATIVE
Blood, UA: NEGATIVE
Glucose, UA: NEGATIVE
KETONES UA: NEGATIVE
LEUKOCYTES UA: NEGATIVE
NITRITE UA: NEGATIVE
PH UA: 5.5
PROTEIN UA: NEGATIVE
Spec Grav, UA: 1.025
UROBILINOGEN UA: 0.2

## 2015-09-14 LAB — CBC WITH DIFFERENTIAL/PLATELET
BASOS PCT: 0.4 % (ref 0.0–3.0)
Basophils Absolute: 0 10*3/uL (ref 0.0–0.1)
EOS ABS: 0.2 10*3/uL (ref 0.0–0.7)
Eosinophils Relative: 3.8 % (ref 0.0–5.0)
HEMATOCRIT: 44.6 % (ref 39.0–52.0)
Hemoglobin: 15.6 g/dL (ref 13.0–17.0)
LYMPHS ABS: 2.2 10*3/uL (ref 0.7–4.0)
LYMPHS PCT: 44.1 % (ref 12.0–46.0)
MCHC: 34.9 g/dL (ref 30.0–36.0)
MCV: 84.4 fl (ref 78.0–100.0)
Monocytes Absolute: 0.3 10*3/uL (ref 0.1–1.0)
Monocytes Relative: 6.3 % (ref 3.0–12.0)
NEUTROS ABS: 2.3 10*3/uL (ref 1.4–7.7)
Neutrophils Relative %: 45.4 % (ref 43.0–77.0)
PLATELETS: 279 10*3/uL (ref 150.0–400.0)
RBC: 5.28 Mil/uL (ref 4.22–5.81)
RDW: 13.2 % (ref 11.5–15.5)
WBC: 5 10*3/uL (ref 4.0–10.5)

## 2015-09-14 LAB — LIPID PANEL
CHOLESTEROL: 217 mg/dL — AB (ref 0–200)
HDL: 39.2 mg/dL (ref >39.00)
LDL Cholesterol: 140 mg/dL — ABNORMAL HIGH (ref 0–99)
NONHDL: 177.49
TRIGLYCERIDES: 189 mg/dL — AB (ref 0.0–149.0)
Total CHOL/HDL Ratio: 6
VLDL: 37.8 mg/dL (ref 0.0–40.0)

## 2015-09-14 LAB — TSH: TSH: 3.67 u[IU]/mL (ref 0.35–4.50)

## 2015-09-14 LAB — HEPATIC FUNCTION PANEL
ALBUMIN: 4.4 g/dL (ref 3.5–5.2)
ALT: 32 U/L (ref 0–53)
AST: 19 U/L (ref 0–37)
Alkaline Phosphatase: 73 U/L (ref 39–117)
BILIRUBIN DIRECT: 0.2 mg/dL (ref 0.0–0.3)
TOTAL PROTEIN: 7.2 g/dL (ref 6.0–8.3)
Total Bilirubin: 1 mg/dL (ref 0.2–1.2)

## 2015-09-14 LAB — BASIC METABOLIC PANEL
BUN: 12 mg/dL (ref 6–23)
CHLORIDE: 105 meq/L (ref 96–112)
CO2: 26 meq/L (ref 19–32)
CREATININE: 0.87 mg/dL (ref 0.40–1.50)
Calcium: 9.6 mg/dL (ref 8.4–10.5)
GFR: 123.57 mL/min (ref >60.00)
GLUCOSE: 111 mg/dL — AB (ref 70–99)
Potassium: 4.1 mEq/L (ref 3.5–5.1)
Sodium: 138 mEq/L (ref 135–145)

## 2015-09-16 ENCOUNTER — Encounter: Payer: Self-pay | Admitting: Family Medicine

## 2015-09-16 ENCOUNTER — Ambulatory Visit (INDEPENDENT_AMBULATORY_CARE_PROVIDER_SITE_OTHER): Payer: Managed Care, Other (non HMO) | Admitting: Family Medicine

## 2015-09-16 VITALS — BP 148/102 | HR 92 | Temp 98.1°F | Ht 68.0 in | Wt 217.0 lb

## 2015-09-16 DIAGNOSIS — E1169 Type 2 diabetes mellitus with other specified complication: Secondary | ICD-10-CM | POA: Insufficient documentation

## 2015-09-16 DIAGNOSIS — I10 Essential (primary) hypertension: Secondary | ICD-10-CM

## 2015-09-16 DIAGNOSIS — Z0000 Encounter for general adult medical examination without abnormal findings: Secondary | ICD-10-CM | POA: Diagnosis not present

## 2015-09-16 DIAGNOSIS — E785 Hyperlipidemia, unspecified: Secondary | ICD-10-CM

## 2015-09-16 DIAGNOSIS — R739 Hyperglycemia, unspecified: Secondary | ICD-10-CM | POA: Insufficient documentation

## 2015-09-16 DIAGNOSIS — E119 Type 2 diabetes mellitus without complications: Secondary | ICD-10-CM | POA: Insufficient documentation

## 2015-09-16 NOTE — Patient Instructions (Addendum)
Make sure to get that flu shot within a month or so  At risk for diabetes. Cholesterol high but not at level to need medicine yet- hopeful we can avoid this. Let's try to get your weight closer to 200 or below with regular exercise and healthy eating  Blood pressure is high but just took your meds this morning. Let's have you schedule an afternoon appointment in 4-6 weeks. Bring your log of blood pressures (check daily) as well as your cuff with you. If above 140/90 be happy to see you sooner.

## 2015-09-16 NOTE — Assessment & Plan Note (Signed)
S: 6.6% 10 year risk. Not on statin or aspirin A/P: continue without rx. Encouraged need for healthy eating, regular exercise, weight loss. Goal 200 lbs over 6 months

## 2015-09-16 NOTE — Assessment & Plan Note (Signed)
S: poorly controlled but literally just took meds before visit. Controlled last visit. Patient states at home he thinks around 140- he has a cuff BP Readings from Last 3 Encounters:  09/16/15 148/102  06/08/15 130/78  05/13/15 150/102  A/P: per avs- 4-6 week follow up for recheck with home cuff and log. May need hctz added

## 2015-09-16 NOTE — Progress Notes (Signed)
[name] Conch, MD Phone: [phone]  Subjective:  Patient presents today for their annual physical. Chief complaint-noted.   Overall doing well. Monitoring BP at home but cannot remember #s exactly.  ROS- full  review of systems was completed and negative. Specifically no chest pain, shortness of breath, headache, blurry vision. No recent herpes outbreak  The following were reviewed and entered/updated in epic: Past Medical History  Diagnosis Date  . Hypertension   . Herpes    Patient Active Problem List   Diagnosis Date Noted  . Hyperglycemia 09/16/2015    Priority: Medium  . Hyperlipidemia 09/16/2015    Priority: Medium  . Hypertension 09/09/2014    Priority: Medium  . Herpes 09/09/2014    Priority: Low   Past Surgical History  Procedure Laterality Date  . None      Family History  Problem Relation Age of Onset  . Diabetes Mother   . Kidney disease Brother     transplant    Medications- reviewed and updated Current Outpatient Prescriptions  Medication Sig Dispense Refill  . amLODipine (NORVASC) 10 MG tablet Take 1 tablet (10 mg total) by mouth daily. 90 tablet 3  . valACYclovir (VALTREX) 500 MG tablet Take 1 tablet (500 mg total) by mouth 2 (two) times daily. 6 tablet 5  . fluticasone (FLONASE) 50 MCG/ACT nasal spray Place 1 spray into both nostrils daily. (Patient not taking: Reported on 06/08/2015) 16 g 2   No current facility-administered medications for this visit.    Allergies-reviewed and updated Allergies  Allergen Reactions  . Lisinopril     Vertigo thought potentially caused by lisinopril- resolved off med    Social History   Social History  . Marital Status: Married    Spouse Name: N/A  . Number of Children: N/A  . Years of Education: N/A   Social History Main Topics  . Smoking status: Never Smoker   . Smokeless tobacco: None  . Alcohol Use: 0.5 oz/week    1 drink(s) per week     Comment: max 3.   . Drug Use: No  . Sexual Activity:  Yes   Other Topics Concern  . None   Social History Narrative   Married 2005 to [employer]. 1 son who is at [employer], [employer].       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    BA from [employer].       Hobbies: gamer-[employer] of duty, [employer], nba 2    ROS--See HPI   Objective: BP 148/102 mmHg  Pulse 92  Temp(Src) 98.1 F (36.7 C)  Ht  (1.727 m)  Wt 217 lb (98.431 kg)  BMI 33.00 kg/m2 Gen: NAD, resting comfortably HEENT: Mucous membranes are moist. Oropharynx normal Neck: no thyromegaly CV: RRR no murmurs rubs or gallops Lungs: CTAB no crackles, wheeze, rhonchi Abdomen: soft/nontender/nondistended/normal bowel sounds. No rebound or guarding.  Rectal: normal tone, normal prostate, no masses or tenderness Ext: no edema, 2+ DP pulses Skin: warm, dry, some acne on back and chest Neuro: grossly normal, moves all extremities, PERRLA  Assessment/Plan:  44 y.o. male presenting for annual physical.  Health Maintenance counseling: 1. Anticipatory guidance: Patient counseled regarding regular dental exams, wearing seatbelts, wearing sunscreen 2. Risk factor reduction:  Advised patient of need for regular exercise and diet rich and fruits and vegetables to reduce risk of heart attack and stroke.  3. Immunizations/screenings/ancillary studies Health Maintenance Due  Topic Date Due  . INFLUENZA VACCINE - planning on next  month 07/25/2015  4. Prostate cancer screening- rectal today low risk. Did not have PSA with labs. He wants to be screened for prostate cancer despite grade D evidence USPTF- he will request PSA next year with labs but does not want to repeat labs today 5. Colon cancer screening - start age 55, no family history colon cancer. 2013 had colonoscopy for rectal bleeding and was normal.    Hyperlipidemia S: 6.6% 10 year risk. Not on statin or aspirin A/P: continue without rx. Encouraged need for healthy eating, regular exercise, weight loss. Goal  200 lbs over 6 months     Hypertension S: poorly controlled but literally just took meds before visit. Controlled last visit. Patient states at home he thinks around 140- he has a cuff BP Readings from Last 3 Encounters:  09/16/15 148/102  06/08/15 130/78  05/13/15 150/102  A/P: per avs- 4-6 week follow up for recheck with home cuff and log. May need hctz added   Hyperglycemia Fasting CBG 111 in 2016. On other hand Lab Results  Component Value Date   HGBA1C 5.0 06/08/2015  Regardless needs healthy eating, weight loss, exercise which was encouraged  4-6 wk bp check

## 2015-09-16 NOTE — Assessment & Plan Note (Signed)
Fasting CBG 111 in 2016. On other hand Lab Results  Component Value Date   HGBA1C 5.0 06/08/2015  Regardless needs healthy eating, weight loss, exercise which was encouraged

## 2015-10-28 ENCOUNTER — Ambulatory Visit: Payer: Managed Care, Other (non HMO) | Admitting: Family Medicine

## 2015-11-22 ENCOUNTER — Ambulatory Visit (INDEPENDENT_AMBULATORY_CARE_PROVIDER_SITE_OTHER): Payer: Managed Care, Other (non HMO) | Admitting: Family Medicine

## 2015-11-22 ENCOUNTER — Encounter: Payer: Self-pay | Admitting: Family Medicine

## 2015-11-22 VITALS — BP 130/80 | HR 86 | Temp 98.3°F | Wt 220.0 lb

## 2015-11-22 DIAGNOSIS — I10 Essential (primary) hypertension: Secondary | ICD-10-CM

## 2015-11-22 DIAGNOSIS — E785 Hyperlipidemia, unspecified: Secondary | ICD-10-CM

## 2015-11-22 NOTE — Progress Notes (Signed)
Tana ConchStephen Hunter, MD  Subjective:  [name] [name] is a 44 y.o. year old very pleasant male patient who presents for/with See problem oriented charting ROS- No chest pain or shortness of breath. No headache or blurry vision.   Past Medical History-  Patient Active Problem List   Diagnosis Date Noted  . Hyperglycemia 09/16/2015    Priority: Medium  . Hyperlipidemia 09/16/2015    Priority: Medium  . Hypertension 09/09/2014    Priority: Medium  . Herpes 09/09/2014    Priority: Low   Medications- reviewed and updated Current Outpatient Prescriptions  Medication Sig Dispense Refill  . amLODipine (NORVASC) 10 MG tablet Take 1 tablet (10 mg total) by mouth daily. 90 tablet 3  . fluticasone (FLONASE) 50 MCG/ACT nasal spray Place 1 spray into both nostrils daily. (Patient not taking: Reported on 06/08/2015) 16 g 2  . valACYclovir (VALTREX) 500 MG tablet Take 1 tablet (500 mg total) by mouth 2 (two) times daily. (Patient not taking: Reported on 11/22/2015) 6 tablet 5   No current facility-administered medications for this visit.    Objective: BP 130/80 mmHg  Pulse 86  Temp(Src) 98.3 F (36.8 C)  Wt 220 lb (99.791 kg) Gen: NAD, resting comfortably CV: RRR no murmurs rubs or gallops Lungs: CTAB no crackles, wheeze, rhonchi Abdomen: soft/nontender/nondistended/normal bowel sounds. No rebound or guarding. Overweight.  Ext: no edema Skin: warm, dry Neuro: grossly normal, moves all extremities  Assessment/Plan:  Hypertension S: controlled on amlodipine 10mg . Had just taken BP med before last visit.  BP Readings from Last 3 Encounters:  11/22/15 130/80  09/16/15 148/102  06/08/15 130/78  A/P:Continue current meds:  No change. Did give DASH handout as well as counseled on need for weight loss.    Hyperlipidemia S: mild poor control on no medication.  Lab Results  Component Value Date   CHOL 217* 09/14/2015   HDL 39.20 09/14/2015   LDLCALC 140* 09/14/2015   TRIG 189.0*  09/14/2015   CHOLHDL 6 09/14/2015   A/P: Encouraged need for healthy eating, regular exercise, weight loss. Recheck at physical and recalculate 10 year risk. Discussed weight goal of 200 lbs over next 6 months. DASH eating plan and exercise can certainly help.    6 month follow up

## 2015-11-22 NOTE — Assessment & Plan Note (Signed)
S: mild poor control on no medication.  Lab Results  Component Value Date   CHOL 217* 09/14/2015   HDL 39.20 09/14/2015   LDLCALC 140* 09/14/2015   TRIG 189.0* 09/14/2015   CHOLHDL 6 09/14/2015   A/P: Encouraged need for healthy eating, regular exercise, weight loss. Recheck at physical and recalculate 10 year risk. Discussed weight goal of 200 lbs over next 6 months. DASH eating plan and exercise can certainly help.

## 2015-11-22 NOTE — Patient Instructions (Signed)
Blood pressure looks great. Continue amlodipine 5mg .   In regards to weight, goal is to be 216 or 217 on January 5th which means you have some work to do. Exercise is key and dash eating plan should help  DASH Eating Plan DASH stands for "Dietary Approaches to Stop Hypertension." The DASH eating plan is a healthy eating plan that has been shown to reduce high blood pressure (hypertension). Additional health benefits may include reducing the risk of type 2 diabetes mellitus, heart disease, and stroke. The DASH eating plan may also help with weight loss. WHAT DO I NEED TO KNOW ABOUT THE DASH EATING PLAN? For the DASH eating plan, you will follow these general guidelines:  Choose foods with a percent daily value for sodium of less than 5% (as listed on the food label).  Use salt-free seasonings or herbs instead of table salt or sea salt.  Check with your health care provider or pharmacist before using salt substitutes.  Eat lower-sodium products, often labeled as "lower sodium" or "no salt added."  Eat fresh foods.  Eat more vegetables, fruits, and low-fat dairy products.  Choose whole grains. Look for the word "whole" as the first word in the ingredient list.  Choose fish and skinless chicken or [place]key more often than red meat. Limit fish, poultry, and meat to 6 oz (170 g) each day.  Limit sweets, desserts, sugars, and sugary drinks.  Choose heart-healthy fats.  Limit cheese to 1 oz (28 g) per day.  Eat more home-cooked food and less restaurant, buffet, and fast food.  Limit fried foods.  Cook foods using methods other than frying.  Limit canned vegetables. If you do use them, rinse them well to decrease the sodium.  When eating at a restaurant, ask that your food be prepared with less salt, or no salt if possible. WHAT FOODS CAN I EAT? Seek help from a dietitian for individual calorie needs. Grains Whole grain or whole wheat bread. Brown rice. Whole grain or whole wheat  pasta. Quinoa, bulgur, and whole grain cereals. Low-sodium cereals. Corn or whole wheat flour tortillas. Whole grain cornbread. Whole grain crackers. Low-sodium crackers. Vegetables Fresh or frozen vegetables (raw, steamed, roasted, or grilled). Low-sodium or reduced-sodium tomato and vegetable juices. Low-sodium or reduced-sodium tomato sauce and paste. Low-sodium or reduced-sodium canned vegetables.  Fruits All fresh, canned (in natural juice), or frozen fruits. Meat and Other Protein Products Ground beef (85% or leaner), grass-fed beef, or beef trimmed of fat. Skinless chicken or [place]key. Ground chicken or [place]key. Pork trimmed of fat. All fish and seafood. Eggs. Dried beans, peas, or lentils. Unsalted nuts and seeds. Unsalted canned beans. Dairy Low-fat dairy products, such as skim or 1% milk, 2% or reduced-fat cheeses, low-fat ricotta or cottage cheese, or plain low-fat yogurt. Low-sodium or reduced-sodium cheeses. Fats and Oils Tub margarines without trans fats. Light or reduced-fat mayonnaise and salad dressings (reduced sodium). Avocado. Safflower, olive, or canola oils. Natural peanut or almond butter. Other Unsalted popcorn and pretzels. The items listed above may not be a complete list of recommended foods or beverages. Contact your dietitian for more options. WHAT FOODS ARE NOT RECOMMENDED? Grains White bread. White pasta. White rice. Refined cornbread. Bagels and croissants. Crackers that contain trans fat. Vegetables Creamed or fried vegetables. Vegetables in a cheese sauce. Regular canned vegetables. Regular canned tomato sauce and paste. Regular tomato and vegetable juices. Fruits Dried fruits. Canned fruit in light or heavy syrup. Fruit juice. Meat and Other Protein Products Fatty cuts  of meat. Ribs, chicken wings, bacon, sausage, bologna, salami, chitterlings, fatback, hot dogs, bratwurst, and packaged luncheon meats. Salted nuts and seeds. Canned beans with salt. Dairy Whole  or 2% milk, cream, half-and-half, and cream cheese. Whole-fat or sweetened yogurt. Full-fat cheeses or blue cheese. Nondairy creamers and whipped toppings. Processed cheese, cheese spreads, or cheese curds. Condiments Onion and garlic salt, seasoned salt, table salt, and sea salt. Canned and packaged gravies. Worcestershire sauce. Tartar sauce. Barbecue sauce. Teriyaki sauce. Soy sauce, including reduced sodium. Steak sauce. Fish sauce. Oyster sauce. Cocktail sauce. Horseradish. Ketchup and mustard. Meat flavorings and tenderizers. Bouillon cubes. Hot sauce. Tabasco sauce. Marinades. Taco seasonings. Relishes. Fats and Oils Butter, stick margarine, lard, shortening, ghee, and bacon fat. Coconut, palm kernel, or palm oils. Regular salad dressings. Other Pickles and olives. Salted popcorn and pretzels. The items listed above may not be a complete list of foods and beverages to avoid. Contact your dietitian for more information. WHERE CAN I FIND MORE INFORMATION? National Heart, Lung, and Blood Institute: travelstabloid.com   This information is not intended to replace advice given to you by your health care provider. Make sure you discuss any questions you have with your health care provider.   Document Released: 11/29/2011 Document Revised: 12/31/2014 Document Reviewed: 10/14/2013 Elsevier Interactive Patient Education [employer].

## 2015-11-22 NOTE — Assessment & Plan Note (Signed)
S: controlled on amlodipine 10mg . Had just taken BP med before last visit.  BP Readings from Last 3 Encounters:  11/22/15 130/80  09/16/15 148/102  06/08/15 130/78  A/P:Continue current meds:  No change. Did give DASH handout as well as counseled on need for weight loss.

## 2016-05-28 ENCOUNTER — Ambulatory Visit (INDEPENDENT_AMBULATORY_CARE_PROVIDER_SITE_OTHER): Payer: Managed Care, Other (non HMO) | Admitting: Family Medicine

## 2016-05-28 ENCOUNTER — Encounter: Payer: Self-pay | Admitting: Family Medicine

## 2016-05-28 VITALS — BP 142/98 | HR 80 | Temp 98.1°F | Ht 68.0 in | Wt 212.0 lb

## 2016-05-28 DIAGNOSIS — E785 Hyperlipidemia, unspecified: Secondary | ICD-10-CM

## 2016-05-28 DIAGNOSIS — I10 Essential (primary) hypertension: Secondary | ICD-10-CM | POA: Diagnosis not present

## 2016-05-28 NOTE — Assessment & Plan Note (Signed)
S: controlled on amlodipine 10mg  last visit. Plan last visit was to add DASH diet and work on weight loss. Checks at home every few weeks and always <140/90. Just took medicine 1;30 today. Attributes 8 lbs weight loss to more active job and cutting down on portion sizes.  BP Readings from Last 3 Encounters:  05/28/16 142/98  11/22/15 130/80  09/16/15 148/102  A/P:Continue current meds:  Suspect meds have not fully kicked in (missed dose this am and took just before coming). Will do home monitoring and follow up with me by mychart- may add hctz if elevated at home. See avs

## 2016-05-28 NOTE — Progress Notes (Signed)
Subjective:  [name] [name] is a 45 y.o. year old very pleasant male patient who presents for/with See problem oriented charting ROS- No chest pain or shortness of breath. No headache or blurry vision. see any ROS included in HPI as well.   Past Medical History-  Patient Active Problem List   Diagnosis Date Noted  . Hyperglycemia 09/16/2015    Priority: Medium  . Hyperlipidemia 09/16/2015    Priority: Medium  . Hypertension 09/09/2014    Priority: Medium  . Genital herpes 09/09/2014    Priority: Low    Medications- reviewed and updated Current Outpatient Prescriptions  Medication Sig Dispense Refill  . amLODipine (NORVASC) 10 MG tablet Take 1 tablet (10 mg total) by mouth daily. 90 tablet 3  . fluticasone (FLONASE) 50 MCG/ACT nasal spray Place 1 spray into both nostrils daily. 16 g 2  . valACYclovir (VALTREX) 500 MG tablet Take 1 tablet (500 mg total) by mouth 2 (two) times daily. 6 tablet 5   No current facility-administered medications for this visit.    Objective: BP 142/98 mmHg  Pulse 80  Temp(Src) 98.1 F (36.7 C) (Oral)  Ht 5\' 8"  (1.727 m)  Wt 212 lb (96.163 kg)  BMI 32.24 kg/m2 Gen: NAD, resting comfortably CV: RRR no murmurs rubs or gallops Lungs: CTAB no crackles, wheeze, rhonchi Abdomen: soft/nontender/nondistended/normal bowel sounds. Overweight.  Ext: no edema Skin: warm, dry Neuro: grossly normal, moves all extremities  Assessment/Plan:  Hypertension S: controlled on amlodipine 10mg  last visit. Plan last visit was to add DASH diet and work on weight loss. Checks at home every few weeks and always <140/90. Just took medicine 1;30 today. Attributes 8 lbs weight loss to more active job and cutting down on portion sizes.  BP Readings from Last 3 Encounters:  05/28/16 142/98  11/22/15 130/80  09/16/15 148/102  A/P:Continue current meds:  Suspect meds have not fully kicked in (missed dose this am and took just before coming). Will do home monitoring and  follow up with me by mychart- may add hctz if elevated at home. See avs  Hyperlipidemia S: mild poor controlled on no medicine last visit. No myalgias. Plan was for weight loss Lab Results  Component Value Date   CHOL 217* 09/14/2015   HDL 39.20 09/14/2015   LDLCALC 140* 09/14/2015   TRIG 189.0* 09/14/2015   CHOLHDL 6 09/14/2015   A/P: 8 lbs down- recalculate 10 year risk of MI/CVA at follow up and consider statin- though honestly would likely focus on lifestyle changes if still felt like we had some room to go before starting statin    Return in about 4 months (around 09/27/2016) for physical. Return precautions advised.   Tana ConchStephen Hunter, MD

## 2016-05-28 NOTE — Assessment & Plan Note (Signed)
S: mild poor controlled on no medicine last visit. No myalgias. Plan was for weight loss Lab Results  Component Value Date   CHOL 217* 09/14/2015   HDL 39.20 09/14/2015   LDLCALC 140* 09/14/2015   TRIG 189.0* 09/14/2015   CHOLHDL 6 09/14/2015   A/P: 8 lbs down- recalculate 10 year risk of MI/CVA at follow up and consider statin- though honestly would likely focus on lifestyle changes if still felt like we had some room to go before starting statin

## 2016-05-28 NOTE — Patient Instructions (Addendum)
Blood pressure is coming down as medicine has time to kick in. Ideally we want you <140/90. You have done your part with weight loss great job losing 8 lbs- keep up the great work and keep working toward 200 or less.   I would like for you to use a home cuff to check at least 4x a week. Your goal is <140/90. If you note in the next few weeks that it is higher than our goal, please call me as I may add a medication called hydrochlorothiazide. Otherwise, message me in 4 weeks through mychart with all your readings. Depending on results- may add additional medicine. Consider 1-2 month follow up but definitely at physical in about 4 months

## 2016-07-18 ENCOUNTER — Other Ambulatory Visit: Payer: Self-pay | Admitting: Family Medicine

## 2016-07-19 NOTE — Telephone Encounter (Signed)
Rx refill sent to pharmacy. 

## 2016-08-31 ENCOUNTER — Other Ambulatory Visit: Payer: Managed Care, Other (non HMO)

## 2016-09-02 IMAGING — CT CT HEAD W/O CM
1 series · 16 of 30 positions shown, 20 images · non-contrast
Comparison: None.

CLINICAL DATA: Dizziness, 5 hours duration.  Nausea and vomiting.

EXAM:
CT HEAD WITHOUT CONTRAST
TECHNIQUE: Contiguous axial images were obtained from the base of the skull
through the vertex without intravenous contrast.

[Series 2: head 4.8 h37s · axial · 0.47mm/px · z∈[+1032,+1169]mm · 16 of 32 slices shown, 20 images]
[im 2/32  brain]
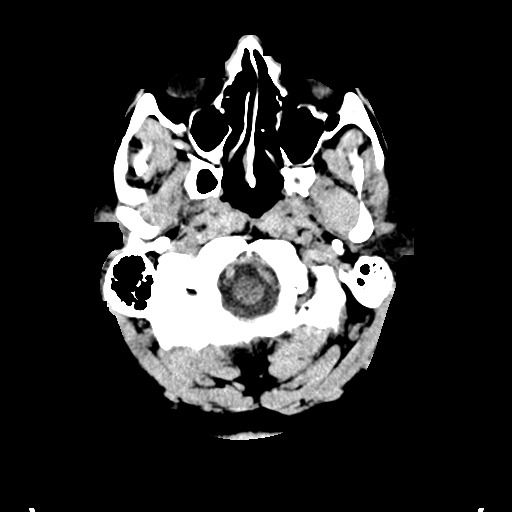
[im 2/32  bone]
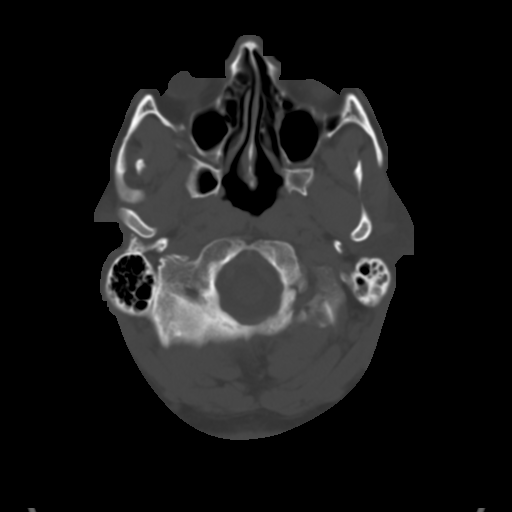
[im 4/32  brain]
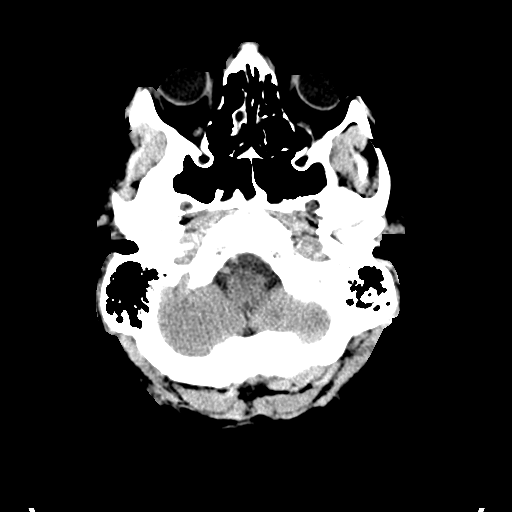
[im 6/32  brain]
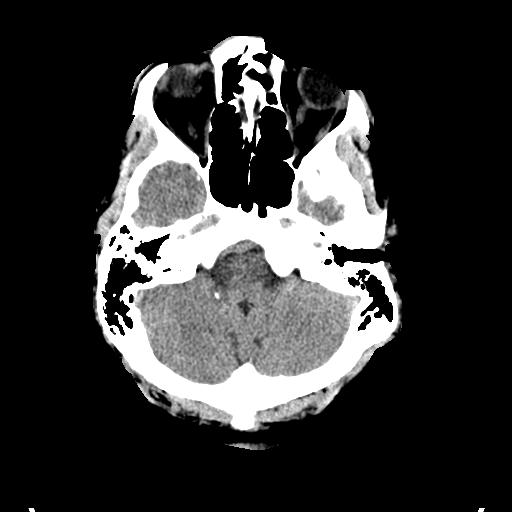
[im 8/32  brain]
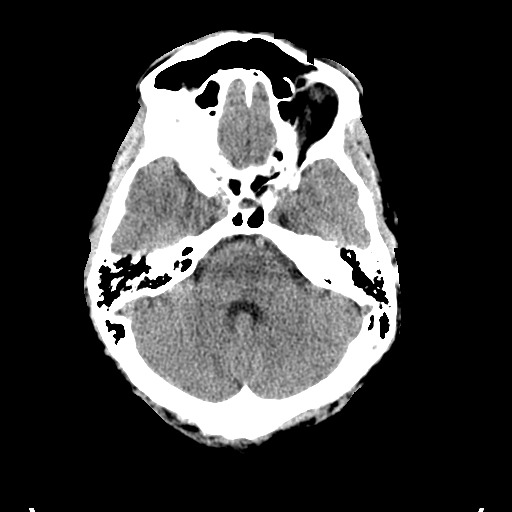
[im 9/32  brain]
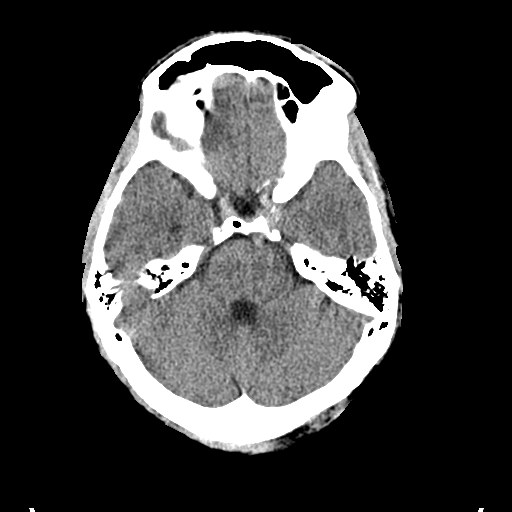
[im 9/32  bone]
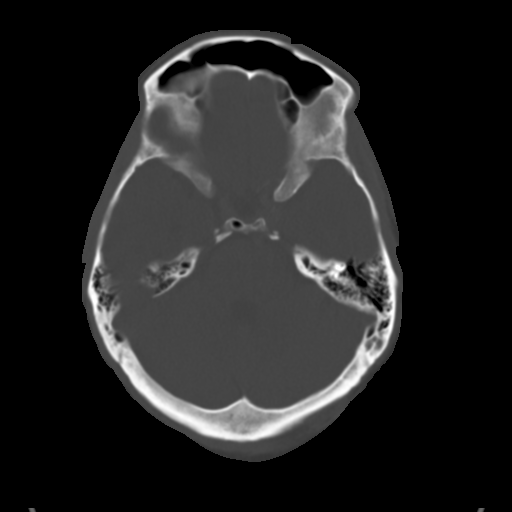
[im 11/32  brain]
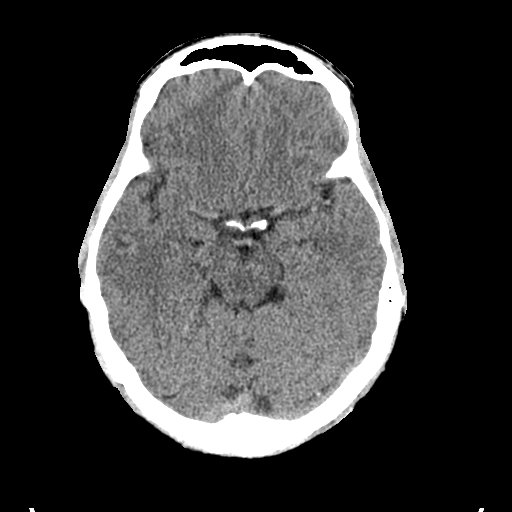
[im 13/32  brain]
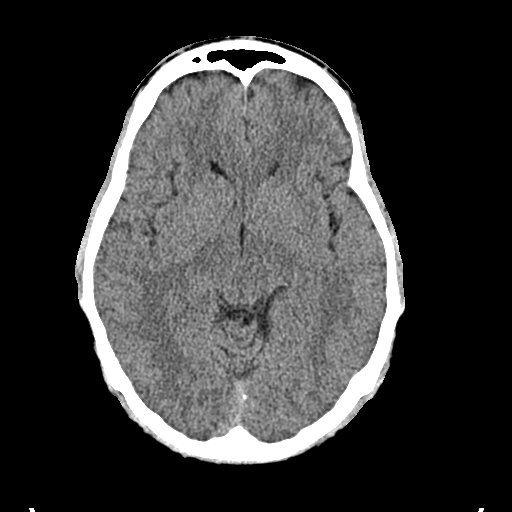
[im 15/32  brain]
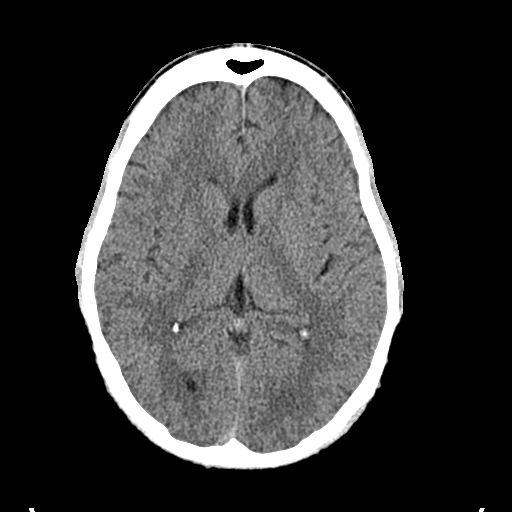
[im 17/32  brain]
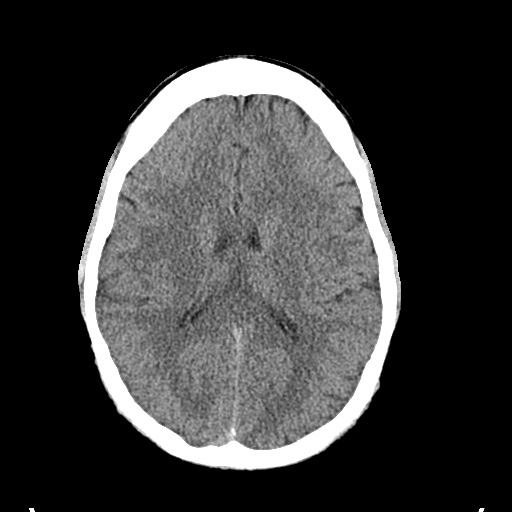
[im 17/32  bone]
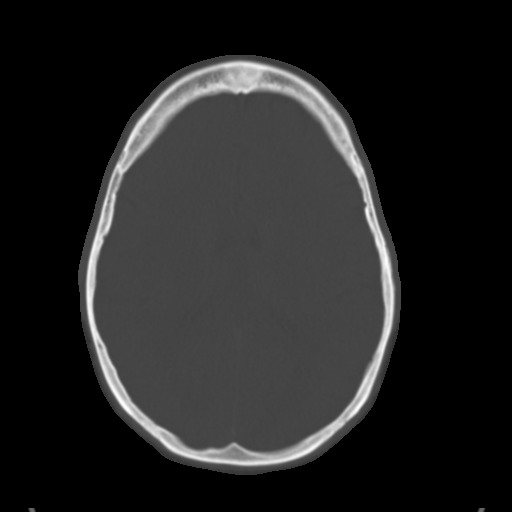
[im 19/32  brain]
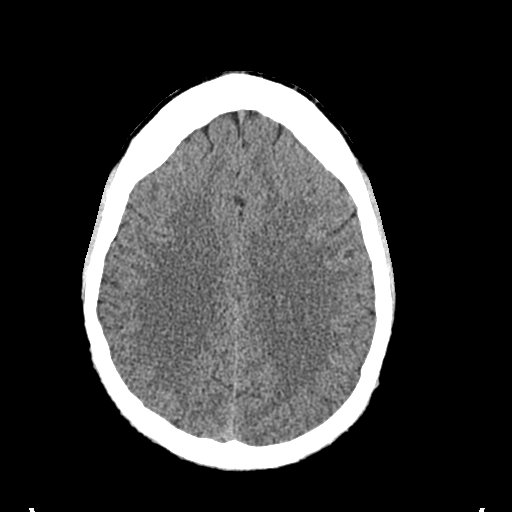
[im 21/32  brain]
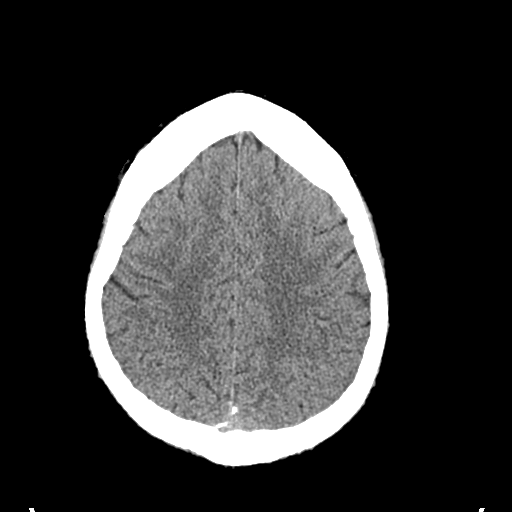
[im 23/32  brain]
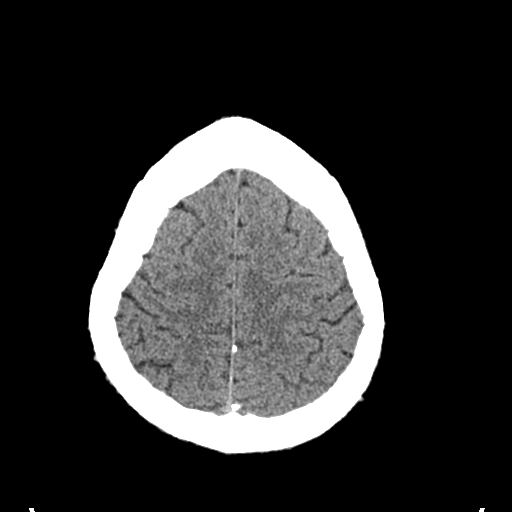
[im 24/32  brain]
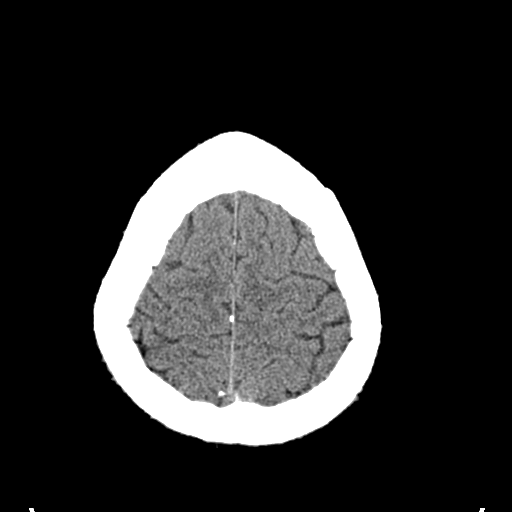
[im 24/32  bone]
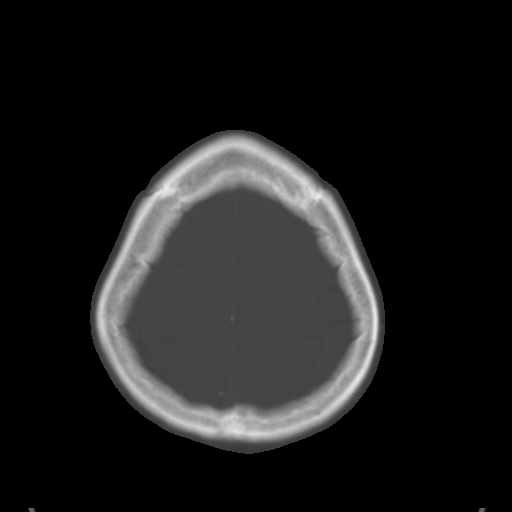
[im 26/32  brain]
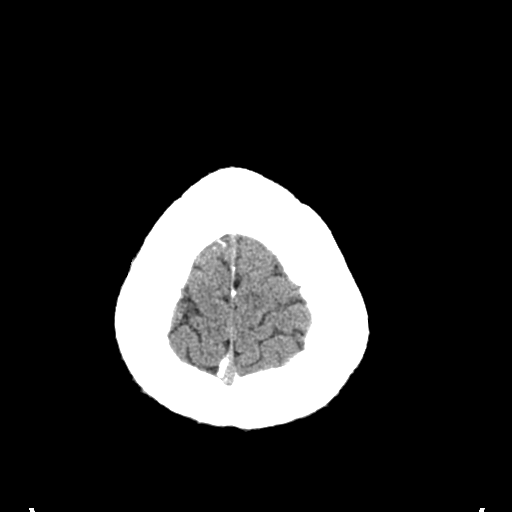
[im 28/32  brain]
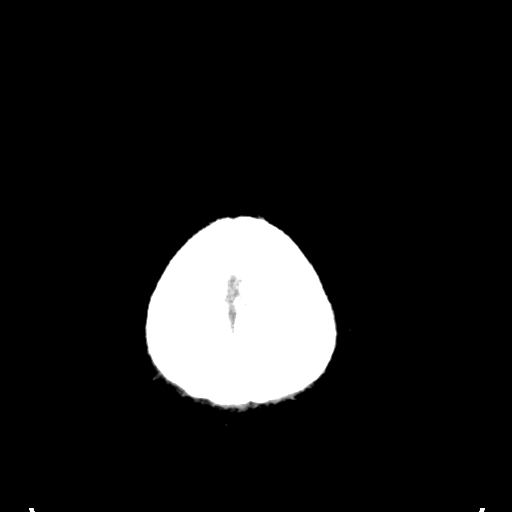
[im 30/32  brain]
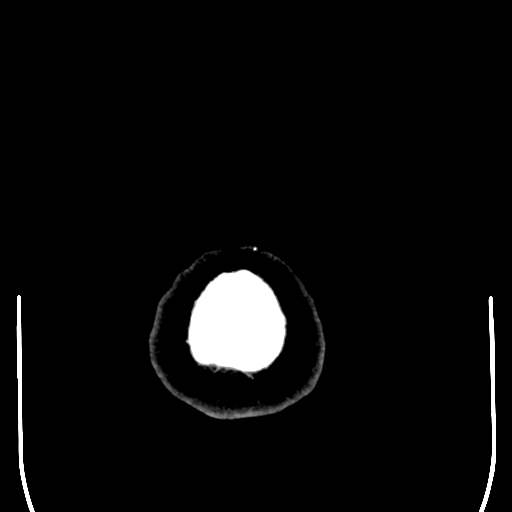

[16 of 30 positions shown; findings below may reference images not displayed]

FINDINGS: The brain has a normal appearance without evidence of atrophy,
infarction, mass lesion, hemorrhage, hydrocephalus or extra-axial
collection. The calvarium is unremarkable. The paranasal sinuses,
middle ears and mastoids are clear except for a very tiny amount of
fluid in the mastoid air cells on the left, probably subclinical.
IMPRESSION: Normal

## 2016-09-07 ENCOUNTER — Encounter: Payer: Managed Care, Other (non HMO) | Admitting: Family Medicine

## 2016-09-13 ENCOUNTER — Other Ambulatory Visit: Payer: Managed Care, Other (non HMO)

## 2016-09-18 ENCOUNTER — Encounter: Payer: Managed Care, Other (non HMO) | Admitting: Family Medicine

## 2016-09-18 DIAGNOSIS — Z0289 Encounter for other administrative examinations: Secondary | ICD-10-CM

## 2016-10-24 ENCOUNTER — Encounter (HOSPITAL_BASED_OUTPATIENT_CLINIC_OR_DEPARTMENT_OTHER): Payer: Self-pay | Admitting: Emergency Medicine

## 2016-10-24 ENCOUNTER — Emergency Department (HOSPITAL_BASED_OUTPATIENT_CLINIC_OR_DEPARTMENT_OTHER)
Admission: EM | Admit: 2016-10-24 | Discharge: 2016-10-24 | Disposition: A | Discharge status: Home / Self Care | Payer: Managed Care, Other (non HMO) | Attending: Emergency Medicine | Admitting: Emergency Medicine

## 2016-10-24 DIAGNOSIS — I10 Essential (primary) hypertension: Secondary | ICD-10-CM | POA: Insufficient documentation

## 2016-10-24 DIAGNOSIS — R42 Dizziness and giddiness: Secondary | ICD-10-CM | POA: Insufficient documentation

## 2016-10-24 DIAGNOSIS — R51 Headache: Secondary | ICD-10-CM | POA: Insufficient documentation

## 2016-10-24 LAB — BASIC METABOLIC PANEL
ANION GAP: 7 (ref 5–15)
BUN: 9 mg/dL (ref 6–20)
CALCIUM: 9.4 mg/dL (ref 8.9–10.3)
CHLORIDE: 102 mmol/L (ref 101–111)
CO2: 28 mmol/L (ref 22–32)
Creatinine, Ser: 0.81 mg/dL (ref 0.61–1.24)
Glucose, Bld: 100 mg/dL — ABNORMAL HIGH (ref 65–99)
Potassium: 3.9 mmol/L (ref 3.5–5.1)
SODIUM: 137 mmol/L (ref 135–145)

## 2016-10-24 LAB — CBC
HEMATOCRIT: 44.1 % (ref 39.0–52.0)
Hemoglobin: 16.3 g/dL (ref 13.0–17.0)
MCH: 29.2 pg (ref 26.0–34.0)
MCHC: 37 g/dL — AB (ref 30.0–36.0)
MCV: 78.9 fL (ref 78.0–100.0)
Platelets: 300 10*3/uL (ref 150–400)
RBC: 5.59 MIL/uL (ref 4.22–5.81)
RDW: 13.1 % (ref 11.5–15.5)
WBC: 4.8 10*3/uL (ref 4.0–10.5)

## 2016-10-24 LAB — CBG MONITORING, ED: Glucose-Capillary: 90 mg/dL (ref 65–99)

## 2016-10-24 MED ORDER — MECLIZINE HCL 25 MG PO TABS: 25.0000 mg | ORAL_TABLET | Freq: Three times a day (TID) | ORAL | 0 refills | Status: DC | PRN

## 2016-10-24 MED FILL — MECLIZINE 25 MG TABLET: 25 | 10 days supply | Qty: 30 | Fill #0

## 2016-10-24 NOTE — ED Triage Notes (Signed)
Patient reports dizziness which began last night.  States he took BP meds as scheduled but this morning feels "off".  Denies nausea, vomiting.  Reports "slight headache, like a pulsing".  Denies chest pain, shortness of breath. Patient ambulatory to registration without difficulty.  Patient currently in wheelchair in [place].

## 2016-10-24 NOTE — ED Provider Notes (Signed)
[facility] Provider Note   CSN: [id number] Arrival date & time: 10/24/16  1258     History   Chief Complaint Chief Complaint  Patient presents with  . Dizziness    HPI [name] [name] is a 45 y.o. male.  The history is provided by the patient.  Dizziness  Quality:  Room spinning and vertigo Severity:  Moderate Onset quality:  Gradual Duration:  10 hours Timing:  Intermittent Progression:  Partially resolved Chronicity:  Recurrent Context comment:  Noticed today while he was getting ready for work.  he then laid back down and when he got out of bed at 10am sx were much worse Relieved by:  Being still Worsened by:  Standing up (walking) Ineffective treatments:  Fluids Associated symptoms: headaches   Associated symptoms: no chest pain, no hearing loss, no nausea, no shortness of breath, no syncope, no tinnitus, no vision changes, no vomiting and no weakness   Risk factors: hx of vertigo   Risk factors: no hx of stroke, no multiple medications and no new medications   Risk factors comment:  Hx of HTN   Past Medical History:  Diagnosis Date  . Herpes   . Hypertension     Patient Active Problem List   Diagnosis Date Noted  . Hyperglycemia 09/16/2015  . Hyperlipidemia 09/16/2015  . Hypertension 09/09/2014  . Genital herpes 09/09/2014    Past Surgical History:  Procedure Laterality Date  . none         Home Medications    Prior to Admission medications   Medication Sig Start Date End Date Taking? Authorizing Provider  amLODipine (NORVASC) 10 MG tablet TAKE 1 TABLET (10 MG TOTAL) BY MOUTH DAILY. 07/19/16   Shelva MajesticStephen O Hunter, MD  fluticasone (FLONASE) 50 MCG/ACT nasal spray Place 1 spray into both nostrils daily. 04/14/15   Shon Batonourtney F Horton, MD  meclizine (ANTIVERT) 25 MG tablet Take 1 tablet (25 mg total) by mouth 3 (three) times daily as needed for dizziness. 10/24/16   Gwyneth SproutWhitney Booker Bhatnagar, MD  valACYclovir (VALTREX) 500 MG tablet Take 1 tablet  (500 mg total) by mouth 2 (two) times daily. 09/30/14   Shelva MajesticStephen O Hunter, MD    Family History Family History  Problem Relation Age of Onset  . Diabetes Mother   . Kidney disease Brother     transplant    Social History Social History  Substance Use Topics  . Smoking status: Never Smoker  . Smokeless tobacco: Never Used  . Alcohol use 0.5 oz/week    1 Standard drinks or equivalent per week     Comment: max 3.      Allergies   Lisinopril   Review of Systems Review of Systems  HENT: Negative for hearing loss and tinnitus.   Respiratory: Negative for shortness of breath.   Cardiovascular: Negative for chest pain and syncope.  Gastrointestinal: Negative for nausea and vomiting.  Neurological: Positive for dizziness and headaches. Negative for weakness.  All other systems reviewed and are negative.    Physical Exam Updated Vital Signs BP 137/84   Pulse 77   Temp 98 F (36.7 C) (Oral)   Resp 21   Ht 5\' 9"  (1.753 m)   Wt 205 lb (93 kg)   SpO2 100%   BMI 30.27 kg/m   Physical Exam  Constitutional: He is oriented to person, place, and time. He appears well-developed and well-nourished. No distress.  HENT:  Head: Normocephalic and atraumatic.  Right Ear: Tympanic membrane normal.  Left  Ear: Tympanic membrane normal.  Mouth/Throat: Oropharynx is clear and moist.  Eyes: Conjunctivae and EOM are normal. Pupils are equal, round, and reactive to light. Right eye exhibits no discharge. Left eye exhibits no discharge.  Neck: Normal range of motion. Neck supple. No spinous process tenderness present. No neck rigidity. No Brudzinski's sign and no Kernig's sign noted.  Cardiovascular: Normal rate, normal heart sounds and intact distal pulses.   No murmur heard. Pulmonary/Chest: Effort normal and breath sounds normal. No respiratory distress. He has no wheezes. He has no rales.  Abdominal: Soft. He exhibits no distension. There is no tenderness.  Musculoskeletal: Normal range  of motion. He exhibits no edema or tenderness.  Lymphadenopathy:    He has no cervical adenopathy.  Neurological: He is alert and oriented to person, place, and time. He has normal strength. No cranial nerve deficit or sensory deficit. Coordination and gait normal. GCS eye subscore is 4. GCS verbal subscore is 5. GCS motor subscore is 6.  Normal finger to nose.  Normal gait without ataxia  Skin: Skin is warm and dry.  Psychiatric: He has a normal mood and affect. His behavior is normal.  Nursing note and vitals reviewed.    ED Treatments / Results  Labs (all labs ordered are listed, but only abnormal results are displayed) Labs Reviewed  BASIC METABOLIC PANEL - Abnormal; Notable for the following:       Result Value   Glucose, Bld 100 (*)    All other components within normal limits  CBC - Abnormal; Notable for the following:    MCHC 37.0 (*)    All other components within normal limits  URINALYSIS, ROUTINE W REFLEX MICROSCOPIC (NOT AT [facility])  CBG MONITORING, ED    EKG  EKG Interpretation  Date/Time:  Wednesday October 24 2016 13:08:36 EDT Ventricular Rate:  86 PR Interval:  224 QRS Duration: 106 QT Interval:  396 QTC Calculation: 473 R Axis:   5 Text Interpretation:  Sinus rhythm with 1st degree A-V block Incomplete right bundle branch block Borderline ECG Confirmed by Lincoln Brighamees, Liz [phone]([id number]) on 10/24/2016 1:12:07 PM       Radiology No results found.  Procedures Procedures (including critical care time)  Medications Ordered in ED Medications - No data to display   Initial Impression / Assessment and Plan / ED Course  I have reviewed the triage vital signs and the nursing notes.  Pertinent labs & imaging results that were available during my care of the patient were reviewed by me and considered in my medical decision making (see chart for details).  Clinical Course   Pt with sx most consistent with peripheral vertigo.  No systemic or infectious sx.  Normal neuro  exam without weakness, ataxia or cerebellar findings on exam.  Normal vision.  Sx are reproducible with attempting to walk and sx are mild.  No hx of Stroke and low likelihood.  Normal VS. Will treat for peripheral vertigo and re-eval.    Final Clinical Impressions(s) / ED Diagnoses   Final diagnoses:  Vertigo    New Prescriptions Discharge Medication List as of 10/24/2016  3:24 PM    START taking these medications   Details  meclizine (ANTIVERT) 25 MG tablet Take 1 tablet (25 mg total) by mouth 3 (three) times daily as needed for dizziness., Starting Wed 10/24/2016, Print         Gwyneth SproutWhitney Narvel Kozub, MD 10/24/16 [phone]

## 2016-10-24 NOTE — ED Notes (Signed)
Pt unable to give a UA at this time

## 2016-11-02 ENCOUNTER — Encounter: Payer: Self-pay | Admitting: Family Medicine

## 2016-11-02 ENCOUNTER — Ambulatory Visit (INDEPENDENT_AMBULATORY_CARE_PROVIDER_SITE_OTHER): Payer: Managed Care, Other (non HMO) | Admitting: Family Medicine

## 2016-11-02 VITALS — BP 134/88 | HR 90 | Temp 98.1°F | Wt 215.0 lb

## 2016-11-02 DIAGNOSIS — H8110 Benign paroxysmal vertigo, unspecified ear: Secondary | ICD-10-CM

## 2016-11-02 MED ORDER — MECLIZINE HCL 25 MG PO TABS: 25.0000 mg | ORAL_TABLET | Freq: Three times a day (TID) | ORAL | 0 refills | Status: DC | PRN

## 2016-11-02 NOTE — Progress Notes (Signed)
Subjective:  Wynonia HazardFrederick Moreland is a 45 y.o. year old very pleasant male patient who presents for/with See problem oriented charting ROS- No facial or extremity weakness. No slurred words or trouble swallowing. no blurry vision or double vision. No paresthesias. No confusion or word finding difficulties.Marland Kitchen.see any ROS included in HPI as well.   Past Medical History-  Patient Active Problem List   Diagnosis Date Noted  . Hyperglycemia 09/16/2015    Priority: Medium  . Hyperlipidemia 09/16/2015    Priority: Medium  . Hypertension 09/09/2014    Priority: Medium  . Benign paroxysmal positional vertigo 11/03/2016    Priority: Low  . Genital herpes 09/09/2014    Priority: Low    Medications- reviewed and updated Current Outpatient Prescriptions  Medication Sig Dispense Refill  . amLODipine (NORVASC) 10 MG tablet TAKE 1 TABLET (10 MG TOTAL) BY MOUTH DAILY. 90 tablet 3  . fluticasone (FLONASE) 50 MCG/ACT nasal spray Place 1 spray into both nostrils daily. 16 g 2  . meclizine (ANTIVERT) 25 MG tablet Take 1 tablet (25 mg total) by mouth 3 (three) times daily as needed for dizziness. 30 tablet 0  . valACYclovir (VALTREX) 500 MG tablet Take 1 tablet (500 mg total) by mouth 2 (two) times daily. 6 tablet 5   No current facility-administered medications for this visit.     Objective: BP 134/88 (BP Location: Left Arm, Patient Position: Sitting, Cuff Size: Large)   Pulse 90   Temp 98.1 F (36.7 C) (Oral)   Wt 215 lb (97.5 kg)   SpO2 97%   BMI 31.75 kg/m  Gen: NAD, resting comfortably CV: RRR no murmurs rubs or gallops Lungs: CTAB no crackles, wheeze, rhonchi Ext: no edema Neuro: CN II-XII intact, sensation and reflexes normal throughout, 5/5 muscle strength in bilateral upper and lower extremities. Normal finger to nose. Normal rapid alternating movements. No pronator drift. Normal romberg. Normal gait.   dix hallpike negative, head thrust negative   Assessment/Plan:  Benign paroxysmal  positional vertigo S: Has been seen in ED twice over last 18 months for vertigo. This most recent episode was about a week ago. He complains of episodes of room spinning lasting 50 seconds at longest but often shorter. Severe spinning and feels uncomfortable walking and has to hold on. He had a reassuring neuro exam. Last year presentation was not classic as this one is not either- dix hallpike negative, not always predictable head movements and had reassuring MR brain. He has no other symptoms other than heart rate elevates some but he states that is because he feels unsteady. He is at least 25% better but symptoms lingering longer than last year. Meclizine helping minimally- helped more at first. Staying really still seems to help with resolution  No tinnitus. No hearing loss.   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 Patient had MR brain last year with episode last year like this reassuring. We opted to trial vestibular rehab and reassess if not improving- also think it will be good for him to learn exercises as could be recurrent in future although improving slowly at present and could be resolved by time of rehab   Orders Placed This Encounter  Procedures  . Ambulatory referral to Physical Therapy    Referral Priority:   Urgent    Referral Type:   Physical Medicine    Referral Reason:   Specialty Services Required  Requested Specialty:   Physical Therapy    Number of Visits Requested:   1   Refill provided as running short Meds ordered this encounter  Medications  . meclizine (ANTIVERT) 25 MG tablet    Sig: Take 1 tablet (25 mg total) by mouth 3 (three) times daily as needed for dizziness.    Dispense:  30 tablet    Refill:  0    Return precautions advised.  Tana ConchStephen Ariele Vidrio, MD

## 2016-11-02 NOTE — Patient Instructions (Signed)
We will call you within a week about your referral to vestibular rehab. If you do not hear within 2 weeks, give us a call.   Can continue meclizine but it is more of a bandaid whereas rehab may help us finally knock out lingering symptoms  If new or worsening symptoms- please seek care   Benign Positional Vertigo Vertigo is the feeling that you or your surroundings are moving when they are not. Benign positional vertigo is the most common form of vertigo. The cause of this condition is not serious (is benign). This condition is triggered by certain movements and positions (is positional). This condition can be dangerous if it occurs while you are doing something that could endanger you or others, such as driving.  CAUSES In many cases, the cause of this condition is not known. It may be caused by a disturbance in an area of the inner ear that helps your brain to sense movement and balance. This disturbance can be caused by a viral infection (labyrinthitis), head injury, or repetitive motion. RISK FACTORS This condition is more likely to develop in:  Women.  People who are 43 years of age or older. SYMPTOMS Symptoms of this condition usually happen when you move your head or your eyes in different directions. Symptoms may start suddenly, and they usually last for less than a minute. Symptoms may include:  Loss of balance and falling.  Feeling like you are spinning or moving.  Feeling like your surroundings are spinning or moving.  Nausea and vomiting.  Blurred vision.  Dizziness.  Involuntary eye movement (nystagmus). Symptoms can be mild and cause only slight annoyance, or they can be severe and interfere with daily life. Episodes of benign positional vertigo may return (recur) over time, and they may be triggered by certain movements. Symptoms may improve over time. DIAGNOSIS This condition is usually diagnosed by medical history and a physical exam of the head, neck, and ears.  You may be referred to a health care provider who specializes in ear, nose, and throat (ENT) problems (otolaryngologist) or a provider who specializes in disorders of the nervous system (neurologist). You may have additional testing, including:  MRI.  A CT scan.  Eye movement tests. Your health care provider may ask you to change positions quickly while he or she watches you for symptoms of benign positional vertigo, such as nystagmus. Eye movement may be tested with an electronystagmogram (ENG), caloric stimulation, the Dix-Hallpike test, or the roll test.  An electroencephalogram (EEG). This records electrical activity in your brain.  Hearing tests. TREATMENT Usually, your health care provider will treat this by moving your head in specific positions to adjust your inner ear back to normal. Surgery may be needed in severe cases, but this is rare. In some cases, benign positional vertigo may resolve on its own in 2-4 weeks. HOME CARE INSTRUCTIONS Safety  Move slowly.Avoid sudden body or head movements.  Avoid driving.  Avoid operating heavy machinery.  Avoid doing any tasks that would be dangerous to you or others if a vertigo episode would occur.  If you have trouble walking or keeping your balance, try using a cane for stability. If you feel dizzy or unstable, sit down right away.  Return to your normal activities as told by your health care provider. Ask your health care provider what activities are safe for you. General Instructions  Take over-the-counter and prescription medicines only as told by your health care provider.  Avoid certain positions or movements  as told by your health care provider.  Drink enough fluid to keep your urine clear or pale yellow.  Keep all follow-up visits as told by your health care provider. This is important. SEEK MEDICAL CARE IF:  You have a fever.  Your condition gets worse or you develop new symptoms.  Your family or friends notice  any behavioral changes.  Your nausea or vomiting gets worse.  You have numbness or a "pins and needles" sensation. SEEK IMMEDIATE MEDICAL CARE IF:  You have difficulty speaking or moving.  You are always dizzy.  You faint.  You develop severe headaches.  You have weakness in your legs or arms.  You have changes in your hearing or vision.  You develop a stiff neck.  You develop sensitivity to light.   This information is not intended to replace advice given to you by your health care provider. Make sure you discuss any questions you have with your health care provider.   Document Released: 09/17/2006 Document Revised: 08/31/2015 Document Reviewed: 04/04/2015 Elsevier Interactive Patient Education [employer]2016 [employer] Inc[employer]

## 2016-11-02 NOTE — Progress Notes (Signed)
Pre visit review using our clinic review tool, if applicable. No additional management support is needed unless otherwise documented below in the visit note. 

## 2016-11-03 DIAGNOSIS — H8110 Benign paroxysmal vertigo, unspecified ear: Secondary | ICD-10-CM | POA: Insufficient documentation

## 2016-11-03 NOTE — Assessment & Plan Note (Signed)
S: Has been seen in ED twice over last 18 months for vertigo. This most recent episode was about a week ago. He complains of episodes of room spinning lasting 50 seconds at longest but often shorter. Severe spinning and feels uncomfortable walking and has to hold on. He had a reassuring neuro exam. Last year presentation was not classic as this one is not either- dix hallpike negative, not always predictable head movements and had reassuring MR brain. He has no other symptoms other than heart rate elevates some but he states that is because he feels unsteady. He is at least 25% better but symptoms lingering longer than last year. Meclizine helping minimally- helped more at first. Staying really still seems to help with resolution  No tinnitus. No hearing loss.   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 Patient had MR brain last year with episode last year like this reassuring. We opted to trial vestibular rehab and reassess if not improving- also think it will be good for him to learn exercises as could be recurrent in future although improving slowly at present and could be resolved by time of rehab

## 2017-02-26 ENCOUNTER — Encounter: Payer: Self-pay | Admitting: Family Medicine

## 2017-02-27 ENCOUNTER — Other Ambulatory Visit: Payer: Self-pay

## 2017-02-27 DIAGNOSIS — R42 Dizziness and giddiness: Secondary | ICD-10-CM

## 2017-03-08 ENCOUNTER — Ambulatory Visit: Payer: Managed Care, Other (non HMO) | Attending: Family Medicine | Admitting: Physical Therapy

## 2017-06-20 ENCOUNTER — Ambulatory Visit (INDEPENDENT_AMBULATORY_CARE_PROVIDER_SITE_OTHER): Payer: Managed Care, Other (non HMO) | Admitting: Family Medicine

## 2017-06-20 ENCOUNTER — Encounter: Payer: Self-pay | Admitting: Family Medicine

## 2017-06-20 VITALS — BP 140/78 | HR 94 | Temp 98.1°F | Ht 69.0 in | Wt 214.4 lb

## 2017-06-20 DIAGNOSIS — M542 Cervicalgia: Secondary | ICD-10-CM | POA: Diagnosis not present

## 2017-06-20 MED ORDER — PREDNISONE 20 MG PO TABS: ORAL_TABLET | ORAL | 0 refills | Status: DC

## 2017-06-20 MED ORDER — CYCLOBENZAPRINE HCL 5 MG PO TABS: 5.0000 mg | ORAL_TABLET | Freq: Three times a day (TID) | ORAL | 1 refills | Status: DC | PRN

## 2017-06-20 NOTE — Patient Instructions (Addendum)
Suspect pinched nerve in neck  Prednisone for 7 days  Flexeril as needed for muscle spasm/neck tightness  If not at least 50% better in 10 days or if you have new or worsening symptoms particularly weakness or worsening pain - lets get you into orthopedics very quickly

## 2017-06-20 NOTE — Progress Notes (Signed)
Subjective:  Wynonia HazardFrederick Junco is a 46 y.o. year old very pleasant male patient who presents for/with See problem oriented charting ROS- no grip weakness or arm weakness. Has pain from neck into right shoulder. No leg weakness. No fecal or urinary incontinence.    Past Medical History-  Patient Active Problem List   Diagnosis Date Noted  . Hyperglycemia 09/16/2015    Priority: Medium  . Hyperlipidemia 09/16/2015    Priority: Medium  . Hypertension 09/09/2014    Priority: Medium  . Benign paroxysmal positional vertigo 11/03/2016    Priority: Low  . Genital herpes 09/09/2014    Priority: Low    Medications- reviewed and updated Current Outpatient Prescriptions  Medication Sig Dispense Refill  . amLODipine (NORVASC) 10 MG tablet TAKE 1 TABLET (10 MG TOTAL) BY MOUTH DAILY. 90 tablet 3  . fluticasone (FLONASE) 50 MCG/ACT nasal spray Place 1 spray into both nostrils daily. 16 g 2  . meclizine (ANTIVERT) 25 MG tablet Take 1 tablet (25 mg total) by mouth 3 (three) times daily as needed for dizziness. 30 tablet 0  . valACYclovir (VALTREX) 500 MG tablet Take 1 tablet (500 mg total) by mouth 2 (two) times daily. 6 tablet 5   No current facility-administered medications for this visit.     Objective: BP 140/78   Pulse 94   Temp 98.1 F (36.7 C) (Oral)   Ht 5\' 9"  (1.753 m)   Wt 214 lb 6.4 oz (97.3 kg)   SpO2 97%   BMI 31.66 kg/m  Gen: NAD, resting comfortably CV: RRR no murmurs rubs or gallops Lungs: CTAB no crackles, wheeze, rhonchi Ext: no edema Skin: warm, dry Neuro: 5/5 strength in upper and lower extremities MSK: tension particularly in right sternocleidomastoid but down into trapezius. Patient with pain in neck and into shoulder with movement to right with his head- good range of motion to left. Limited ROM flexion and extension of neck due to pain. With attempted spurlings has pain in right neck and into shoulder  Assessment/Plan:  Neck pain on right side S: patient has  been dealing with right shoulder/neck pain for at least 36 hours at this point. Started Tuesday night with slight pain but woke up Wednesday morning with substantial pain. Rates as moderate to severe aching pain. No radiation into arm and no paresthesias. Ibuprofen helps some. Movement makes pain worse A/P: Patient Instructions  Suspect pinched nerve in neck  Prednisone for 7 days  Flexeril as needed for muscle spasm/neck tightness  If not at least 50% better in 10 days or if you have new or worsening symptoms particularly weakness or worsening pain - lets get you into orthopedics very quickly we also discussed sports medicine as an option.   Meds ordered this encounter  Medications  . cyclobenzaprine (FLEXERIL) 5 MG tablet    Sig: Take 1 tablet (5 mg total) by mouth 3 (three) times daily as needed for muscle spasms.    Dispense:  30 tablet    Refill:  1  . predniSONE (DELTASONE) 20 MG tablet    Sig: Take 2 pills for 3 days, 1 pill for 4 days    Dispense:  10 tablet    Refill:  0   Return precautions advised.  Tana ConchStephen Hunter, MD

## 2017-10-11 ENCOUNTER — Other Ambulatory Visit: Payer: Self-pay

## 2017-10-11 MED ORDER — AMLODIPINE BESYLATE 10 MG PO TABS: 10.0000 mg | ORAL_TABLET | Freq: Every day | ORAL | 3 refills | Status: DC

## 2017-10-15 ENCOUNTER — Ambulatory Visit (INDEPENDENT_AMBULATORY_CARE_PROVIDER_SITE_OTHER): Payer: 59 | Admitting: Family Medicine

## 2017-10-15 ENCOUNTER — Encounter: Payer: Self-pay | Admitting: Family Medicine

## 2017-10-15 VITALS — BP 124/88 | HR 96 | Temp 98.8°F | Ht 69.0 in | Wt 222.4 lb

## 2017-10-15 DIAGNOSIS — M545 Low back pain, unspecified: Secondary | ICD-10-CM

## 2017-10-15 DIAGNOSIS — L209 Atopic dermatitis, unspecified: Secondary | ICD-10-CM | POA: Insufficient documentation

## 2017-10-15 DIAGNOSIS — L2089 Other atopic dermatitis: Secondary | ICD-10-CM | POA: Diagnosis not present

## 2017-10-15 MED ORDER — TRIAMCINOLONE ACETONIDE 0.1 % EX CREA: 1.0000 "application " | TOPICAL_CREAM | Freq: Two times a day (BID) | CUTANEOUS | 1 refills | Status: DC

## 2017-10-15 MED ORDER — ETODOLAC 300 MG PO CAPS: 300.0000 mg | ORAL_CAPSULE | Freq: Three times a day (TID) | ORAL | 3 refills | Status: DC | PRN

## 2017-10-15 MED ORDER — CYCLOBENZAPRINE HCL 5 MG PO TABS: 5.0000 mg | ORAL_TABLET | Freq: Three times a day (TID) | ORAL | 1 refills | Status: DC | PRN

## 2017-10-15 NOTE — Patient Instructions (Addendum)
If you have leg weakness or worsening back pain despite treatment over the next week see us back or if pain lasts past 6 weeks (typical time frame for flare ups of low back pain).   Meds ordered this encounter  Medications  . triamcinolone cream (KENALOG) 0.1 %    Sig: Apply 1 application topically 2 (two) times daily. 10 days maximum for eczema flares    Dispense:  80 g    Refill:  1  . etodolac (LODINE) 300 MG capsule    Sig: Take 1 capsule (300 mg total) by mouth every 8 (eight) hours as needed.    Dispense:  30 capsule    Refill:  3  . cyclobenzaprine (FLEXERIL) 5 MG tablet    Sig: Take 1 tablet (5 mg total) by mouth 3 (three) times daily as needed for muscle spasms.    Dispense:  30 tablet    Refill:  1

## 2017-10-15 NOTE — Progress Notes (Signed)
Subjective:  [name] [name] is a 46 y.o. year old very pleasant male patient who presents for/with See problem oriented charting ROS-see ROS embedded below  Past Medical History-  Patient Active Problem List   Diagnosis Date Noted  . Hyperglycemia 09/16/2015    Priority: Medium  . Hyperlipidemia 09/16/2015    Priority: Medium  . Hypertension 09/09/2014    Priority: Medium  . Benign paroxysmal positional vertigo 11/03/2016    Priority: Low  . Genital herpes 09/09/2014    Priority: Low  . Atopic dermatitis 10/15/2017    Medications- reviewed and updated Current Outpatient Prescriptions  Medication Sig Dispense Refill  . amLODipine (NORVASC) 10 MG tablet Take 1 tablet (10 mg total) by mouth daily. 90 tablet 3  . fluticasone (FLONASE) 50 MCG/ACT nasal spray Place 1 spray into both nostrils daily. 16 g 2  . meclizine (ANTIVERT) 25 MG tablet Take 1 tablet (25 mg total) by mouth 3 (three) times daily as needed for dizziness. 30 tablet 0  . valACYclovir (VALTREX) 500 MG tablet Take 1 tablet (500 mg total) by mouth 2 (two) times daily. 6 tablet 5  . cyclobenzaprine (FLEXERIL) 5 MG tablet Take 1 tablet (5 mg total) by mouth 3 (three) times daily as needed for muscle spasms. 30 tablet 1  . etodolac (LODINE) 300 MG capsule Take 1 capsule (300 mg total) by mouth every 8 (eight) hours as needed. 30 capsule 3  . triamcinolone cream (KENALOG) 0.1 % Apply 1 application topically 2 (two) times daily. 10 days maximum for eczema flares 80 g 1   No current facility-administered medications for this visit.     Objective: BP 124/88 (BP Location: Left Arm, Patient Position: Sitting, Cuff Size: Large)   Pulse 96   Temp 98.8 F (37.1 C) (Oral)   Ht 5\' 9"  (1.753 m)   Wt 222 lb 6.4 oz (100.9 kg)   SpO2 98%   BMI 32.84 kg/m  Gen: NAD, resting comfortably CV: RRR no obvious murmur Lungs: Nonlabored no wheeze Ext: no edema Skin: warm, dry  Back - Normal skin, Spine with normal alignment and no  deformity.  No tenderness to vertebral process palpation.  Paraspinous muscles are tender in the right low back with associated spasm. Range of motion is full at neck. Forward flexion at lumbar sacral region limited compared to baseline- still nearly can touch toes. Negative Straight leg raise.  Neuro- no saddle anesthesia, 5/5 strength lower extremities, 2+ reflexes   Assessment/Plan:  Back Pain S: Pain started on Saturday- states over last 10 years has had intermittent right low back pain but has been a while since issue. Pain 8/10 at worst. Now 6/10. Etodolac helped in past. Flexeril helped with pain in neck last viist. Pain getting slightly better. Aspirin helps slightly. Worse with bending/twisting. Feels tight in low back. Mild pain into right hamstring at times.   ROS-No saddle anesthesia, bladder incontinence, fecal incontinence, weakness in extremity, numbness or tingling in extremity. History negative for trauma, history of cancer.  A/P: Acute low back pain without sciatica.  NSAIDs in the form of etodolac have been very helpful in the past.  Refilled this patient and allowed a few refills.  We did discuss potential effects on blood pressure.  Short-term use likely okay.  Also refilled Flexeril which really helped for his neck.  Atopic dermatitis Triamcinolone acetate 0.1% from derm in past.  He denies active flares right now but asks for refill to be made available in case he has one.  I reviewed with him appropriate use and potential for hypopigmentation.  Meds ordered this encounter  Medications  . triamcinolone cream (KENALOG) 0.1 %    Sig: Apply 1 application topically 2 (two) times daily. 10 days maximum for eczema flares    Dispense:  80 g    Refill:  1  . etodolac (LODINE) 300 MG capsule    Sig: Take 1 capsule (300 mg total) by mouth every 8 (eight) hours as needed.    Dispense:  30 capsule    Refill:  3  . cyclobenzaprine (FLEXERIL) 5 MG tablet    Sig: Take 1 tablet (5 mg  total) by mouth 3 (three) times daily as needed for muscle spasms.    Dispense:  30 tablet    Refill:  1   Return precautions advised.  Tana Conch, MD

## 2017-10-15 NOTE — Assessment & Plan Note (Signed)
Triamcinolone acetate 0.1% from derm in past.  He denies active flares right now but asks for refill to be made available in case he has one.  I reviewed with him appropriate use and potential for hypopigmentation.

## 2018-01-17 ENCOUNTER — Encounter: Payer: Self-pay | Admitting: Family Medicine

## 2018-01-17 ENCOUNTER — Ambulatory Visit: Payer: 59 | Admitting: Family Medicine

## 2018-01-17 ENCOUNTER — Ambulatory Visit (INDEPENDENT_AMBULATORY_CARE_PROVIDER_SITE_OTHER): Payer: 59 | Admitting: Family Medicine

## 2018-01-17 VITALS — BP 136/94 | HR 84 | Temp 98.7°F | Ht 69.0 in | Wt 222.8 lb

## 2018-01-17 DIAGNOSIS — A084 Viral intestinal infection, unspecified: Secondary | ICD-10-CM

## 2018-01-17 DIAGNOSIS — B9789 Other viral agents as the cause of diseases classified elsewhere: Secondary | ICD-10-CM

## 2018-01-17 DIAGNOSIS — J329 Chronic sinusitis, unspecified: Secondary | ICD-10-CM

## 2018-01-17 DIAGNOSIS — R509 Fever, unspecified: Secondary | ICD-10-CM

## 2018-01-17 LAB — POCT INFLUENZA A/B
INFLUENZA A, POC: NEGATIVE
INFLUENZA B, POC: NEGATIVE

## 2018-01-17 MED ORDER — FLUTICASONE PROPIONATE 50 MCG/ACT NA SUSP: 2.0000 | Freq: Every day | NASAL | 2 refills | Status: DC

## 2018-01-17 NOTE — Progress Notes (Signed)
PCP: Shelva MajesticHunter, Stephen O, MD  Subjective:  [name] [name] is a 47 y.o. year old very pleasant male patient who presents with flu like symptoms including subjective fever,body aches at first. -other symptoms: also had sore throat on Monday, sneezing, coughing, sinus pressure by Tuesday. Felt slightly better on Wednesday then started with diarrhea and stomach pressure with bowel movements up to 8/10. 3 episodes of diarrhea on Thursday- slight diarrhea this AM x2. No blood in stool. No vomiting. Has thickyellow discharge from nares and sinus pressure but getting some better.  -started: 5 days ago -inside 48 hour treatment window if needed for tamiflu: no -high risk condition (children <5, adults >65, chronic pulmonary or cardiac condition, immunosuppression, pregnancy, nursing home resident, morbid obesity) : no -symptoms are improving -previous treatments: acetaminophen, rest, hydration - patient did not receive flu shot this year.  - denies sick contact; specifically influenza: no  ROS-denies SOB, NVD, sinus or dental pain  Pertinent Past Medical History-  Patient Active Problem List   Diagnosis Date Noted  . Hyperglycemia 09/16/2015    Priority: Medium  . Hyperlipidemia 09/16/2015    Priority: Medium  . Hypertension 09/09/2014    Priority: Medium  . Benign paroxysmal positional vertigo 11/03/2016    Priority: Low  . Genital herpes 09/09/2014    Priority: Low  . Atopic dermatitis 10/15/2017    Medications- reviewed  Current Outpatient Medications  Medication Sig Dispense Refill  . amLODipine (NORVASC) 10 MG tablet Take 1 tablet (10 mg total) by mouth daily. 90 tablet 3  . cyclobenzaprine (FLEXERIL) 5 MG tablet Take 1 tablet (5 mg total) by mouth 3 (three) times daily as needed for muscle spasms. 30 tablet 1  . etodolac (LODINE) 300 MG capsule Take 1 capsule (300 mg total) by mouth every 8 (eight) hours as needed. 30 capsule 3  . fluticasone (FLONASE) 50 MCG/ACT nasal spray Place  1 spray into both nostrils daily. 16 g 2  . meclizine (ANTIVERT) 25 MG tablet Take 1 tablet (25 mg total) by mouth 3 (three) times daily as needed for dizziness. 30 tablet 0  . triamcinolone cream (KENALOG) 0.1 % Apply 1 application topically 2 (two) times daily. 10 days maximum for eczema flares 80 g 1  . valACYclovir (VALTREX) 500 MG tablet Take 1 tablet (500 mg total) by mouth 2 (two) times daily. 6 tablet 5   Objective: BP (!) 136/94 (BP Location: Left Arm, Patient Position: Sitting, Cuff Size: Large)   Pulse 84   Temp 98.7 F (37.1 C) (Oral)   Ht 5\' 9"  (1.753 m)   Wt 222 lb 12.8 oz (101.1 kg)   SpO2 98%   BMI 32.90 kg/m  Gen: NAD, appears fatigued HEENT: Turbinates erythematous with yellow discharge, TM normal, pharynx mildly erythematous with no tonsilar exudate or edema, minimal sinus tenderness CV: RRR no murmurs rubs or gallops Lungs: CTAB no crackles, wheeze, rhonchi Abdomen: soft/nontender- other than very mild lower abdominal tenderness/nondistended/normal bowel sounds. Ext: no edema Skin: warm, dry, no rash  Results for orders placed or performed in visit on 01/17/18 (from the past 24 hour(s))  POCT Influenza A/B     Status: None   Collection Time: 01/17/18 11:57 AM  Result Value Ref Range   Influenza A, POC Negative Negative   Influenza B, POC Negative Negative    Assessment/Plan:  Flu-like illness:  History and exam today are suggestive of viral process. Patients influenza test was negative.  Pretest probability of influenza was moderate. High risk condition:  no. With sinus pressure and congestion likely has viral sinusitis and with diarrhea viral gastroenteritis.   Patient will not be treated with Tamiflu. Advised no work until 24 hours diarrhea free Discussion per avs:   "Although this is not influenza (the flu). I still think this is a virus that is flu like. These can last up to 2 weeks but as long as you do not have fever, shortness of breath, or diarrhea  for 24 hours you can return to work. Work note given  If you have diarrhea lasting through Monday or more than 6-10 cases per day let me know. If the sinus portion of your infection does not improve by next Friday let me know as well (sinus pressure and yellow drainage over 10 days we often use antibotics so I could call something in for you at that point)  Finally, we reviewed reasons to return to care including if symptoms worsen or persist or new concerns arise."   Tana Conch, MD

## 2018-01-17 NOTE — Addendum Note (Signed)
Addended by: Shelva MajesticHUNTER, STEPHEN O on: 01/17/2018 01:28 PM   Modules accepted: Orders

## 2018-01-17 NOTE — Patient Instructions (Addendum)
Results for orders placed or performed in visit on 01/17/18 (from the past 24 hour(s))  POCT Influenza A/B     Status: None   Collection Time: 01/17/18 11:57 AM  Result Value Ref Range   Influenza A, POC Negative Negative   Influenza B, POC Negative Negative   Although this is not influenza (the flu). I still think this is a virus that is flu like. These can last up to 2 weeks but as long as you do not have fever, shortness of breath, or diarrhea for 24 hours you can return to work.   If you have diarrhea lasting through Monday or more than 6-10 cases per day let me know. If the sinus portion of your infection does not improve by next Friday let me know as well (sinus pressure and yellow drainage over 10 days we often use antibotics so I could call something in for you at that point)  Finally, we reviewed reasons to return to care including if symptoms worsen or persist or new concerns arise.

## 2018-05-05 ENCOUNTER — Telehealth: Payer: Self-pay | Admitting: Family Medicine

## 2018-05-05 NOTE — Telephone Encounter (Signed)
Copied from CRM [id number]. Topic: Quick Communication - Rx Refill/Question >> May 05, 2018  1:16 PM Alexander Bergeron B wrote: Medication: valACYclovir (VALTREX) 500 MG tablet [[id number]]  Has the patient contacted their pharmacy? Yes.   (Agent: If no, request that the patient contact the pharmacy for the refill.) Preferred Pharmacy (with phone number or street name): [facility] [address] Agent: Please be advised that RX refills may take up to 3 business days. We ask that you follow-up with your pharmacy.

## 2018-05-06 ENCOUNTER — Other Ambulatory Visit: Payer: Self-pay

## 2018-05-06 DIAGNOSIS — B009 Herpesviral infection, unspecified: Secondary | ICD-10-CM

## 2018-05-06 MED ORDER — VALACYCLOVIR HCL 500 MG PO TABS: 500.0000 mg | ORAL_TABLET | Freq: Two times a day (BID) | ORAL | 5 refills | Status: DC

## 2018-05-06 NOTE — Telephone Encounter (Signed)
Noted. Valtrex has been sent in to patient pharmacy

## 2018-05-06 NOTE — Telephone Encounter (Signed)
Pt calling back about RX for Valtrex

## 2018-09-04 ENCOUNTER — Other Ambulatory Visit: Payer: Self-pay

## 2018-09-04 DIAGNOSIS — L2089 Other atopic dermatitis: Secondary | ICD-10-CM

## 2018-09-04 MED ORDER — TRIAMCINOLONE ACETONIDE 0.1 % EX CREA: 1.0000 "application " | TOPICAL_CREAM | Freq: Two times a day (BID) | CUTANEOUS | 1 refills | Status: DC

## 2018-10-20 ENCOUNTER — Other Ambulatory Visit: Payer: Self-pay | Admitting: Family Medicine

## 2018-10-20 NOTE — Telephone Encounter (Signed)
Copied from CRM [id number]. Topic: Quick Communication - Rx Refill/Question >> Oct 20, 2018  1:03 PM Gerrianne Scale wrote: Medication: etodolac (LODINE) 300 MG capsule  Has the patient contacted their pharmacy? Yes.  Last week (Agent: If no, request that the patient contact the pharmacy for the refill.) (Agent: If yes, when and what did the pharmacy advise?) told him to call his provider  Preferred Pharmacy (with phone number or street name): [id number], [place] - [address] [phone] (Phone) [phone] (Fax)    Agent: Please be advised that RX refills may take up to 3 business days. We ask that you follow-up with your pharmacy.

## 2018-10-21 MED ORDER — ETODOLAC 300 MG PO CAPS: 300.0000 mg | ORAL_CAPSULE | Freq: Three times a day (TID) | ORAL | 0 refills | Status: DC | PRN

## 2018-10-21 NOTE — Telephone Encounter (Signed)
May give #15 but also please call to encourage him to schedule office visit

## 2018-10-21 NOTE — Telephone Encounter (Signed)
Ok to fill 

## 2018-10-21 NOTE — Telephone Encounter (Signed)
Requested medication (s) are due for refill today: yes  Requested medication (s) are on the active medication list: yes  Last refill:  10/15/18 #30  Future visit scheduled: no  Notes to clinic:  Last chemistry 10/24/16; last office visit 10/15/17    Requested Prescriptions  Pending Prescriptions Disp Refills   etodolac (LODINE) 300 MG capsule 30 capsule 3    Sig: Take 1 capsule (300 mg total) by mouth every 8 (eight) hours as needed.     Analgesics:  NSAIDS Failed - 10/20/2018  1:11 PM      Failed - Cr in normal range and within 360 days    Creatinine, Ser  Date Value Ref Range Status  10/24/2016 0.81 0.61 - 1.24 mg/dL Final         Failed - HGB in normal range and within 360 days    Hemoglobin  Date Value Ref Range Status  10/24/2016 16.3 13.0 - 17.0 g/dL Final         Passed - Patient is not pregnant      Passed - Valid encounter within last 12 months    Recent Outpatient Visits          9 months ago Viral sinusitis   [facility] [place], Aldine Contes, MD   1 year ago Acute right-sided low back pain without sciatica   [facility], Aldine Contes, MD   1 year ago Neck pain on right side   [facility], Aldine Contes, MD   1 year ago Benign paroxysmal positional vertigo, unspecified laterality   [facility], Aldine Contes, MD   2 years ago Essential hypertension   [facility] [place], Aldine Contes, MD

## 2018-10-24 ENCOUNTER — Encounter: Payer: Self-pay | Admitting: Family Medicine

## 2018-10-24 ENCOUNTER — Ambulatory Visit: Payer: Managed Care, Other (non HMO) | Admitting: Family Medicine

## 2018-10-24 VITALS — BP 116/70 | HR 86 | Temp 98.2°F | Ht 69.0 in | Wt 222.8 lb

## 2018-10-24 DIAGNOSIS — H8110 Benign paroxysmal vertigo, unspecified ear: Secondary | ICD-10-CM | POA: Diagnosis not present

## 2018-10-24 DIAGNOSIS — I10 Essential (primary) hypertension: Secondary | ICD-10-CM

## 2018-10-24 DIAGNOSIS — E785 Hyperlipidemia, unspecified: Secondary | ICD-10-CM | POA: Diagnosis not present

## 2018-10-24 DIAGNOSIS — G8929 Other chronic pain: Secondary | ICD-10-CM

## 2018-10-24 DIAGNOSIS — R739 Hyperglycemia, unspecified: Secondary | ICD-10-CM | POA: Diagnosis not present

## 2018-10-24 DIAGNOSIS — Z23 Encounter for immunization: Secondary | ICD-10-CM | POA: Diagnosis not present

## 2018-10-24 DIAGNOSIS — M545 Low back pain, unspecified: Secondary | ICD-10-CM | POA: Insufficient documentation

## 2018-10-24 LAB — COMPREHENSIVE METABOLIC PANEL
ALT: 48 U/L (ref 0–53)
AST: 32 U/L (ref 0–37)
Albumin: 4.6 g/dL (ref 3.5–5.2)
Alkaline Phosphatase: 62 U/L (ref 39–117)
BILIRUBIN TOTAL: 1.4 mg/dL — AB (ref 0.2–1.2)
BUN: 9 mg/dL (ref 6–23)
CO2: 27 meq/L (ref 19–32)
Calcium: 9.8 mg/dL (ref 8.4–10.5)
Chloride: 102 mEq/L (ref 96–112)
Creatinine, Ser: 0.84 mg/dL (ref 0.40–1.50)
GFR: 126.83 mL/min (ref >60.00)
GLUCOSE: 111 mg/dL — AB (ref 70–99)
Potassium: 3.5 mEq/L (ref 3.5–5.1)
SODIUM: 138 meq/L (ref 135–145)
Total Protein: 7.5 g/dL (ref 6.0–8.3)

## 2018-10-24 LAB — LIPID PANEL
CHOL/HDL RATIO: 6
Cholesterol: 232 mg/dL — ABNORMAL HIGH (ref 0–200)
HDL: 40.7 mg/dL (ref >39.00)
LDL CALC: 161 mg/dL — AB (ref 0–99)
NONHDL: 191.25
Triglycerides: 152 mg/dL — ABNORMAL HIGH (ref 0.0–149.0)
VLDL: 30.4 mg/dL (ref 0.0–40.0)

## 2018-10-24 LAB — CBC
HCT: 49.1 % (ref 39.0–52.0)
Hemoglobin: 17.1 g/dL — ABNORMAL HIGH (ref 13.0–17.0)
MCHC: 34.9 g/dL (ref 30.0–36.0)
MCV: 82.4 fl (ref 78.0–100.0)
PLATELETS: 306 10*3/uL (ref 150.0–400.0)
RBC: 5.96 Mil/uL — ABNORMAL HIGH (ref 4.22–5.81)
RDW: 13.6 % (ref 11.5–15.5)
WBC: 5.1 10*3/uL (ref 4.0–10.5)

## 2018-10-24 LAB — HEMOGLOBIN A1C: Hgb A1c MFr Bld: 5.8 % (ref 4.6–6.5)

## 2018-10-24 MED ORDER — ETODOLAC 300 MG PO CAPS: 300.0000 mg | ORAL_CAPSULE | Freq: Three times a day (TID) | ORAL | 2 refills | Status: DC | PRN

## 2018-10-24 NOTE — Patient Instructions (Addendum)
We will call you within two weeks about your referral to physical therapy for vestibular rehab for your vertigo. If you do not hear within 3 weeks, give [place] a call.   6-9 months for physical  Please stop by lab before you go

## 2018-10-24 NOTE — Assessment & Plan Note (Signed)
S: elevated CBGs in past fasting. States family history of diabetes A/P: not fasting so CBG not as helpful- will get a1c

## 2018-10-24 NOTE — Assessment & Plan Note (Signed)
S: continued intermittent issues with vertigo with position changes. Not as severe as first time in 2017 or so. Has had flare up in last 1-2 weeks A/P: recent flare up/poor control-  he would like to pursue physical therapy for vestibular rehab- refer today

## 2018-10-24 NOTE — Progress Notes (Signed)
Your CBC was normal (blood counts, infection fighting cells, platelets) other than blood being slightly thick- I think working on staying well hydrated will likely help this.  Your CMET was normal (kidney, liver, and electrolytes, blood sugar) except for slightly high sugar and bilirubin once again- not major concerns but should continue to watch.  At risk for diabetes with hemoglobin a1c of 5.8 (at risk from 5.7-6.4). Healthy eating, regular exercise, weight loss advised. Above 6 we could consider metformin to help prevent diabetes. Your cholesterol is elevated on several measures with bad cholesterol above goal of 100 at 161 and total being above 200 at 232. Your 10 year risk of heart attack or stroke is 6.3%. I think its worth a years effort at improved diet along mediterranean diet lines and regular exercise instead of starting medications at present but If we dont see improvement next year may need to consider medication.   The 10-year ASCVD risk score Denman George DC Montez Hageman., et al., 2013) is: 6.3%   Values used to calculate the score:     Age: 47 years     Sex: Male     Is Non-Hispanic African American: Yes     Diabetic: No     Tobacco smoker: No     Systolic Blood Pressure: 116 mmHg     Is BP treated: Yes     HDL Cholesterol: 40.7 mg/dL     Total Cholesterol: 232 mg/dL

## 2018-10-24 NOTE — Assessment & Plan Note (Signed)
S: patient continues on etodolac 300mg  up to 3x a day for low back pain (last seen for this 10/15/17). He has had 10 years of intermittent pain. Pain up to 8/10 at its worst in past. Etodolac and flexeril have helped.   States has not had to use regularly. Did some lifting last weekend and tweaked the back thus the request for refill. Also notes some discomfort in work chair- he is going to try to get a new one there.  A/P: recent flare up of intermittent back pain- reasonable to use etodolac with flare ups- refills provided. Will update kidney function.

## 2018-10-24 NOTE — Assessment & Plan Note (Signed)
S: controlled on amlodipine 10mg  BP Readings from Last 3 Encounters:  10/24/18 116/70  01/17/18 (!) 136/94  10/15/17 124/88  A/P: We discussed blood pressure goal of <140/90. Continue current meds

## 2018-10-24 NOTE — Assessment & Plan Note (Signed)
S: poorly controlled in past Lab Results  Component Value Date   CHOL 217 (H) 09/14/2015   HDL 39.20 09/14/2015   LDLCALC 140 (H) 09/14/2015   TRIG 189.0 (H) 09/14/2015   CHOLHDL 6 09/14/2015   A/P: update lipids with no recent check. Not fasting

## 2018-10-24 NOTE — Progress Notes (Signed)
Subjective:  [name] [name] is a 47 y.o. year old very pleasant male patient who presents for/with See problem oriented charting ROS- No chest pain or shortness of breath. No headache or blurry vision. Intermittent breakout of genital herpes   Past Medical History-  Patient Active Problem List   Diagnosis Date Noted  . Low back pain without sciatica 10/24/2018    Priority: Medium  . Hyperglycemia 09/16/2015    Priority: Medium  . Hyperlipidemia 09/16/2015    Priority: Medium  . Hypertension 09/09/2014    Priority: Medium  . Benign paroxysmal positional vertigo 11/03/2016    Priority: Low  . Genital herpes 09/09/2014    Priority: Low  . Atopic dermatitis 10/15/2017    Medications- reviewed and updated Current Outpatient Medications  Medication Sig Dispense Refill  . amLODipine (NORVASC) 10 MG tablet Take 1 tablet (10 mg total) by mouth daily. 90 tablet 3  . etodolac (LODINE) 300 MG capsule Take 1 capsule (300 mg total) by mouth every 8 (eight) hours as needed. 30 capsule 2  . fluticasone (FLONASE) 50 MCG/ACT nasal spray Place 2 sprays into both nostrils daily. 16 g 2  . meclizine (ANTIVERT) 25 MG tablet Take 1 tablet (25 mg total) by mouth 3 (three) times daily as needed for dizziness. 30 tablet 0  . triamcinolone cream (KENALOG) 0.1 % Apply 1 application topically 2 (two) times daily. 10 days maximum for eczema flares 80 g 1  . valACYclovir (VALTREX) 500 MG tablet Take 1 tablet (500 mg total) by mouth 2 (two) times daily. 6 tablet 5   No current facility-administered medications for this visit.     Objective: BP 116/70 (BP Location: Left Arm, Patient Position: Sitting, Cuff Size: Large)   Pulse 86   Temp 98.2 F (36.8 C) (Oral)   Ht 5\' 9"  (1.753 m)   Wt 222 lb 12.8 oz (101.1 kg)   SpO2 96%   BMI 32.90 kg/m  Gen: NAD, resting comfortably CV: RRR no murmurs rubs or gallops Lungs: CTAB no crackles, wheeze, rhonchi Abdomen: soft/nontender/nondistended/normal bowel  sounds.  Ext: no edema Skin: warm, dry  Assessment/Plan:  Hypertension S: controlled on amlodipine 10mg  BP Readings from Last 3 Encounters:  10/24/18 116/70  01/17/18 (!) 136/94  10/15/17 124/88  A/P: We discussed blood pressure goal of <140/90. Continue current meds  Benign paroxysmal positional vertigo S: continued intermittent issues with vertigo with position changes. Not as severe as first time in 2017 or so. Has had flare up in last 1-2 weeks A/P: recent flare up/poor control-  he would like to pursue physical therapy for vestibular rehab- refer today  Low back pain without sciatica S: patient continues on etodolac 300mg  up to 3x a day for low back pain (last seen for this 10/15/17). He has had 10 years of intermittent pain. Pain up to 8/10 at its worst in past. Etodolac and flexeril have helped.   States has not had to use regularly. Did some lifting last weekend and tweaked the back thus the request for refill. Also notes some discomfort in work chair- he is going to try to get a new one there.  A/P: recent flare up of intermittent back pain- reasonable to use etodolac with flare ups- refills provided. Will update kidney function.   Hyperlipidemia S: poorly controlled in past Lab Results  Component Value Date   CHOL 217 (H) 09/14/2015   HDL 39.20 09/14/2015   LDLCALC 140 (H) 09/14/2015   TRIG 189.0 (H) 09/14/2015   CHOLHDL  6 09/14/2015   A/P: update lipids with no recent check. Not fasting  Hyperglycemia S: elevated CBGs in past fasting. States family history of diabetes A/P: not fasting so CBG not as helpful- will get a1c  Return in about 6 months (around 04/24/2019) for physical.  Lab/Order associations: NOT fasting Need for prophylactic vaccination with combined diphtheria-tetanus-pertussis (DTP) vaccine - Plan: Tdap vaccine greater than or equal to 7yo IM  Essential hypertension - Plan: Lipid panel, CBC, Comprehensive metabolic panel  Benign paroxysmal  positional vertigo, unspecified laterality - Plan: Ambulatory referral to Physical Therapy  Hyperlipidemia, unspecified hyperlipidemia type - Plan: Lipid panel, CBC, Comprehensive metabolic panel  Hyperglycemia - Plan: Hemoglobin A1c  Chronic bilateral low back pain without sciatica  Meds ordered this encounter  Medications  . etodolac (LODINE) 300 MG capsule    Sig: Take 1 capsule (300 mg total) by mouth every 8 (eight) hours as needed.    Dispense:  30 capsule    Refill:  2    Return precautions advised.  Tana Conch, MD

## 2018-12-16 ENCOUNTER — Other Ambulatory Visit: Payer: Self-pay

## 2018-12-16 MED ORDER — AMLODIPINE BESYLATE 10 MG PO TABS: 10.0000 mg | ORAL_TABLET | Freq: Every day | ORAL | 3 refills | Status: DC

## 2019-04-17 ENCOUNTER — Other Ambulatory Visit: Payer: Self-pay

## 2019-04-17 DIAGNOSIS — L2089 Other atopic dermatitis: Secondary | ICD-10-CM

## 2019-04-17 MED ORDER — TRIAMCINOLONE ACETONIDE 0.1 % EX CREA: 1.0000 "application " | TOPICAL_CREAM | Freq: Two times a day (BID) | CUTANEOUS | 0 refills | Status: DC

## 2019-04-17 NOTE — Telephone Encounter (Signed)
Last OV 10/24/18 Last refill 09/04/18 #80g/1 Next OV 05/01/19

## 2019-05-01 ENCOUNTER — Encounter: Payer: Managed Care, Other (non HMO) | Admitting: Family Medicine

## 2019-08-03 ENCOUNTER — Other Ambulatory Visit: Payer: Self-pay

## 2019-08-03 ENCOUNTER — Encounter: Payer: Self-pay | Admitting: Family Medicine

## 2019-08-03 ENCOUNTER — Ambulatory Visit (INDEPENDENT_AMBULATORY_CARE_PROVIDER_SITE_OTHER): Payer: Managed Care, Other (non HMO) | Admitting: Family Medicine

## 2019-08-03 VITALS — BP 160/104 | HR 93 | Temp 98.7°F | Ht 69.0 in | Wt 225.6 lb

## 2019-08-03 DIAGNOSIS — L2089 Other atopic dermatitis: Secondary | ICD-10-CM | POA: Diagnosis not present

## 2019-08-03 DIAGNOSIS — E785 Hyperlipidemia, unspecified: Secondary | ICD-10-CM

## 2019-08-03 DIAGNOSIS — I10 Essential (primary) hypertension: Secondary | ICD-10-CM

## 2019-08-03 DIAGNOSIS — B009 Herpesviral infection, unspecified: Secondary | ICD-10-CM

## 2019-08-03 DIAGNOSIS — R739 Hyperglycemia, unspecified: Secondary | ICD-10-CM

## 2019-08-03 DIAGNOSIS — E669 Obesity, unspecified: Secondary | ICD-10-CM

## 2019-08-03 MED ORDER — AMLODIPINE BESYLATE 10 MG PO TABS: 10.0000 mg | ORAL_TABLET | Freq: Every day | ORAL | 3 refills | Status: DC

## 2019-08-03 MED ORDER — TRIAMCINOLONE ACETONIDE 0.1 % EX CREA: 1.0000 "application " | TOPICAL_CREAM | Freq: Two times a day (BID) | CUTANEOUS | 2 refills | Status: DC

## 2019-08-03 MED ORDER — FLUTICASONE PROPIONATE 50 MCG/ACT NA SUSP: 2.0000 | Freq: Every day | NASAL | 3 refills | Status: DC

## 2019-08-03 MED ORDER — VALACYCLOVIR HCL 500 MG PO TABS: 500.0000 mg | ORAL_TABLET | Freq: Two times a day (BID) | ORAL | 2 refills | Status: DC

## 2019-08-03 MED ORDER — ETODOLAC 300 MG PO CAPS: 300.0000 mg | ORAL_CAPSULE | Freq: Three times a day (TID) | ORAL | 2 refills | Status: DC | PRN

## 2019-08-03 NOTE — Patient Instructions (Addendum)
Health Maintenance Due  Topic Date Due  . INFLUENZA VACCINE  07/25/2019  We should have flu shots available by September. Please strongly consider getting flu shot this year. If you get your flu shot at a pharmacy- please let us know. Can do at physical  Refills today  Lets get you consistently back on amlodipine. Your blood pressure trend concerns me. I would like for you to buy/use a home cuff to check daily. Your goal is <140/90. If you note in the next few weeks that it is higher than our goal, let me sooner. Otherwise, see me at your physical in september. Bring your home cuff and your log of blood pressures with you to visit.   See you next month or sooner if needed

## 2019-08-03 NOTE — Progress Notes (Signed)
Phone [phone]   Subjective:  [name] [name] is a 48 y.o. year old very pleasant male patient who presents for/with See problem oriented charting Chief Complaint  Patient presents with  . Follow-up    Not fasting today.   . Hyperglycemia  . Genital Herpes  . Hypertension  . Hyperlipidemia   ROS-  Denies HA, CP, SOB, dizziness, visual changes   Past Medical History-  Patient Active Problem List   Diagnosis Date Noted  . Low back pain without sciatica 10/24/2018    Priority: Medium  . Hyperglycemia 09/16/2015    Priority: Medium  . Hyperlipidemia 09/16/2015    Priority: Medium  . Hypertension 09/09/2014    Priority: Medium  . Benign paroxysmal positional vertigo 11/03/2016    Priority: Low  . Genital herpes 09/09/2014    Priority: Low  . Atopic dermatitis 10/15/2017    Medications- reviewed and updated Current Outpatient Medications  Medication Sig Dispense Refill  . amLODipine (NORVASC) 10 MG tablet Take 1 tablet (10 mg total) by mouth daily. 90 tablet 3  . etodolac (LODINE) 300 MG capsule Take 1 capsule (300 mg total) by mouth every 8 (eight) hours as needed. 30 capsule 2  . fluticasone (FLONASE) 50 MCG/ACT nasal spray Place 2 sprays into both nostrils daily. 48 g 3  . triamcinolone cream (KENALOG) 0.1 % Apply 1 application topically 2 (two) times daily. 10 days maximum for eczema flares 80 g 2  . valACYclovir (VALTREX) 500 MG tablet Take 1 tablet (500 mg total) by mouth 2 (two) times daily. Prn for 3 days with flare up. 30 tablet 2   No current facility-administered medications for this visit.      Objective:  BP (!) 160/104 (BP Location: Left Arm, Patient Position: Sitting, Cuff Size: Large)   Pulse 93   Temp 98.7 F (37.1 C) (Oral)   Ht 5\' 9"  (1.753 m)   Wt 225 lb 9.6 oz (102.3 kg)   SpO2 95%   BMI 33.32 kg/m  Gen: NAD, resting comfortably CV: RRR no murmurs rubs or gallops Lungs: CTAB no crackles, wheeze, rhonchi Abdomen:  soft/nontender/nondistended Skin: warm, dry Neuro: grossly normal, moves all extremities     Assessment and Plan   #hypertension S: Previously controlled on Amlodipine 10 mg daily-poorly controlled today. Not checking regularly- wants to get a new cuff.Trying to avoid low sodium diet. Avoiding fast food and processed food as much as he can. Not exercising regularly due to covid- had been trying to get in a routine but covid set him back.  BP Readings from Last 3 Encounters:  08/03/19 (!) 160/104  10/24/18 116/70  01/17/18 (!) 136/94  A/P: Very poor control today even on repeat-very well controlled at last visit.  Do not want to overreact to one reading-wonder if infrequent dosing as patient was running out of medicine affected control- refilled amlodipine and encouraged regular use.  He will update me in 2 to 3 weeks by MyChart and if elevated we may add an additional medicine such as hydrochlorothiazide or chlorthalidone  3 lb weight gain may have contributed- patient is going to try to eat healthier and restart exercise  Denies using Lodine recently which could raise blood pressure  We also discussed low-salt diet-and she quit for lunch which could raise blood pressure  #hyperlipidemia/obesity S: Lipids poorly controlled with diet/exercise alone Lab Results  Component Value Date   CHOL 232 (H) 10/24/2018   HDL 40.70 10/24/2018   LDLCALC 161 (H) 10/24/2018   TRIG  152.0 (H) 10/24/2018   CHOLHDL 6 10/24/2018   A/P: Poor control of hyperlipidemia in the past-we discussed healthy eating and regular exercise to try to improve levels- has a physical scheduled in September which will give us another chance to update lipids and blood pressure monitoring in office  # Hyperglycemia S: Prediabetes noted CBGs- Not checking at home.  Exercise and diet-knows he needs to be more conscious about carb and sugar intake. No exercising regularly.  Lab Results  Component Value Date   HGBA1C 5.8  10/24/2018   HGBA1C 5.0 06/08/2015   A/P: Discussed healthy lifestyle changes-update A1c next visit  # Genital Herpes S:Taking Valacyclovir 500 mg BID prn- last flare up over 12 months  A/P: Does well have refill on hand-refilled today  #Atopic dermatitis S: Eucerin twice aday.  Uses triamcinolone with flareups A/P: Stable- refill triamcinolone  Recommended follow up: Physical in a month Future Appointments  Date Time Provider Department Center  09/03/2019  1:40 PM Shelva MajesticHunter, Omari Mcmanaway O, MD [facility]   Lab/Order associations:   ICD-10-CM   1. Essential hypertension  I10   2. Hyperlipidemia, unspecified hyperlipidemia type  E78.5   3. Other atopic dermatitis  L20.89 triamcinolone cream (KENALOG) 0.1 %  4. Herpes  B00.9 valACYclovir (VALTREX) 500 MG tablet  5. Obesity (BMI 30-39.9)  E66.9   6. Hyperglycemia  R73.9     Return precautions advised.  Tana ConchStephen Melessa Cowell, MD

## 2019-08-11 ENCOUNTER — Other Ambulatory Visit: Payer: Self-pay | Admitting: Family Medicine

## 2019-08-11 DIAGNOSIS — B009 Herpesviral infection, unspecified: Secondary | ICD-10-CM

## 2019-09-03 ENCOUNTER — Ambulatory Visit (INDEPENDENT_AMBULATORY_CARE_PROVIDER_SITE_OTHER): Payer: Managed Care, Other (non HMO) | Admitting: Family Medicine

## 2019-09-03 ENCOUNTER — Other Ambulatory Visit (HOSPITAL_COMMUNITY)
Admission: RE | Admit: 2019-09-03 | Discharge: 2019-09-03 | Disposition: A | Discharge status: Home / Self Care | Payer: Managed Care, Other (non HMO) | Source: Ambulatory Visit | Attending: Family Medicine | Admitting: Family Medicine

## 2019-09-03 ENCOUNTER — Encounter: Payer: Self-pay | Admitting: Family Medicine

## 2019-09-03 ENCOUNTER — Other Ambulatory Visit: Payer: Self-pay

## 2019-09-03 VITALS — BP 160/96 | HR 83 | Temp 98.3°F | Ht 69.0 in | Wt 224.2 lb

## 2019-09-03 DIAGNOSIS — Z1211 Encounter for screening for malignant neoplasm of colon: Secondary | ICD-10-CM

## 2019-09-03 DIAGNOSIS — Z113 Encounter for screening for infections with a predominantly sexual mode of transmission: Secondary | ICD-10-CM | POA: Insufficient documentation

## 2019-09-03 DIAGNOSIS — R739 Hyperglycemia, unspecified: Secondary | ICD-10-CM

## 2019-09-03 DIAGNOSIS — Z23 Encounter for immunization: Secondary | ICD-10-CM | POA: Diagnosis not present

## 2019-09-03 DIAGNOSIS — Z125 Encounter for screening for malignant neoplasm of prostate: Secondary | ICD-10-CM

## 2019-09-03 DIAGNOSIS — Z0000 Encounter for general adult medical examination without abnormal findings: Secondary | ICD-10-CM

## 2019-09-03 DIAGNOSIS — I10 Essential (primary) hypertension: Secondary | ICD-10-CM | POA: Diagnosis not present

## 2019-09-03 DIAGNOSIS — Z118 Encounter for screening for other infectious and parasitic diseases: Secondary | ICD-10-CM

## 2019-09-03 DIAGNOSIS — Z114 Encounter for screening for human immunodeficiency virus [HIV]: Secondary | ICD-10-CM

## 2019-09-03 MED ORDER — CHLORTHALIDONE 25 MG PO TABS: 25.0000 mg | ORAL_TABLET | Freq: Every day | ORAL | 3 refills | Status: DC

## 2019-09-03 NOTE — Patient Instructions (Addendum)
Health Maintenance Due  Topic Date Due  . INFLUENZA VACCINE - today 07/25/2019   - blood pressure remains poorly controlled. Will add chlorthalidone 25mg  and follow up 1-2 months.   Could try myfitnesspal for help with weight loss  Please stop by lab before you go- blood and urine If you do not have mychart- we will call you about results within 5 business days of [place] receiving them.  If you have mychart- we will send your results within 3 business days of [place] receiving them.  If abnormal or we want to clarify a result, we will call or mychart you to make sure you receive the message.  If you have questions or concerns or don't hear within 5-7 days, please send [place] a message or call us.

## 2019-09-03 NOTE — Progress Notes (Signed)
Phone: [phone]    Subjective:  Patient presents today for their annual physical. Chief complaint-noted.   See problem oriented charting- ROS- full  review of systems was completed and negative.  The following were reviewed and entered/updated in epic: Past Medical History:  Diagnosis Date  . Herpes   . Hypertension    Patient Active Problem List   Diagnosis Date Noted  . Low back pain without sciatica 10/24/2018    Priority: Medium  . Hyperglycemia 09/16/2015    Priority: Medium  . Hyperlipidemia 09/16/2015    Priority: Medium  . Hypertension 09/09/2014    Priority: Medium  . Benign paroxysmal positional vertigo 11/03/2016    Priority: Low  . Genital herpes 09/09/2014    Priority: Low  . Atopic dermatitis 10/15/2017   Past Surgical History:  Procedure Laterality Date  . none      Family History  Problem Relation Age of Onset  . Diabetes Mother   . Kidney disease Brother        transplant    Medications- reviewed and updated Current Outpatient Medications  Medication Sig Dispense Refill  . amLODipine (NORVASC) 10 MG tablet Take 1 tablet (10 mg total) by mouth daily. 90 tablet 3  . etodolac (LODINE) 300 MG capsule Take 1 capsule (300 mg total) by mouth every 8 (eight) hours as needed. 30 capsule 2  . fluticasone (FLONASE) 50 MCG/ACT nasal spray Place 2 sprays into both nostrils daily. 48 g 3  . triamcinolone cream (KENALOG) 0.1 % Apply 1 application topically 2 (two) times daily. 10 days maximum for eczema flares 80 g 2  . valACYclovir (VALTREX) 500 MG tablet Take 1 tablet (500 mg total) by mouth 2 (two) times daily. Prn for 3 days with flare up. 30 tablet 2  . chlorthalidone (HYGROTON) 25 MG tablet Take 1 tablet (25 mg total) by mouth daily. 90 tablet 3   No current facility-administered medications for this visit.     Allergies-reviewed and updated Allergies  Allergen Reactions  . Lisinopril     Vertigo thought potentially caused by lisinopril-  resolved off med    Social History   Social History Narrative   Married 2005 to [employer]. 1 son who is at [employer], [employer].       Now works with [employer].    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    BA from [employer].       Hobbies: gamer-call of duty, [employer], [employer] 2      Objective:  BP (!) 160/96 (BP Location: Left Arm, Patient Position: Sitting, Cuff Size: Normal)   Pulse 83   Temp 98.3 F (36.8 C) (Temporal)   Ht 5\' 9"  (1.753 m)   Wt 224 lb 3.2 oz (101.7 kg)   SpO2 97%   BMI 33.11 kg/m  Gen: NAD, resting comfortably HEENT: Mucous membranes are moist. Oropharynx normal Neck: no thyromegaly CV: RRR no murmurs rubs or gallops Lungs: CTAB no crackles, wheeze, rhonchi Abdomen: soft/nontender/nondistended/normal bowel sounds. No rebound or guarding.  Ext: no edema Skin: warm, dry Neuro: grossly normal, moves all extremities, PERRLA Rectal: normal tone, normal sized prostate, no masses or tenderness     Assessment and Plan:  48 y.o. male presenting for annual physical.  Health Maintenance counseling: 1. Anticipatory guidance: Patient counseled regarding regular dental exams -q6 months, eye exams - no issues with vision- no regular  checks,  avoiding smoking and second hand smoke- very rare cigar , limiting alcohol to 2 beverages per day- 2 per week.   2. Risk factor reduction:  Advised patient of need for regular exercise and diet rich and fruits and vegetables to reduce risk of heart attack and stroke. Exercise- lacking lately- discussed 150 mins per week- considering going back to gym- he is considering home workouts. Diet-feels like does a fair amount of fast food- he is going to try to at least cut out the fries- maybe go for grilled sandwich. Encouraged eating at home more/home prep more frequently. .  Wt Readings from Last 3 Encounters:   09/03/19 224 lb 3.2 oz (101.7 kg)  08/03/19 225 lb 9.6 oz (102.3 kg)  10/24/18 222 lb 12.8 oz (101.1 kg)  3. Immunizations/screenings/ancillary studies- flu shot given today.  Immunization History  Administered Date(s) Administered  . Td 12/25/2007  . Tdap 10/24/2018  4. Prostate cancer screening- will begin getting PSAs this year. Rectal exam low risk   5. Colon cancer screening - refer today- wants to discuss possible colonoscopy as over age 46 and ACS guidelines 6. Skin cancer screening/prevention- lower risk due to melanin content. advised regular sunscreen use. Denies worrisome, changing, or new skin lesions.  7. Testicular cancer screening- advised monthly self exams  8. STD screening- patient opts in- monogamous but better safe than sorry 9. Occasional cigar smoker- encouraged n osmoking ideally  Status of chronic or acute concerns  Hypertension - Taking Amlodipine 10 mg daily.  - blood pressure remains poorly controlled. Will add chlorthalidone 25mg  and follow up 1-2 months. Possibly repeat BMP to monitor potassium Vertigo   Wants to consider having silidenafil/viagra on hand when blood pressure under better control  Genital Herpes - Taking Valacyclovir 500 mg prn. No outbreaks recently. Has med refill if needed  Hyperlipidemia - update lipids- likely hold off on statin until BP better controlled.   Hyperglycemia - update a1c today- in prediabetes range in the past  eucerin as needed lately- has been doing well with eczema.   Recommended follow up: 1-2 month follow up   Lab/Order associations: fasting    ICD-10-CM   1. Preventative health care  Z00.00 CBC    Comprehensive metabolic panel    Lipid panel    Hemoglobin A1c    Ambulatory referral to Gastroenterology    PSA  2. Screen for colon cancer  Z12.11 Ambulatory referral to Gastroenterology  3. Screening for prostate cancer  Z12.5 PSA  4. Essential hypertension  I10 CBC    Comprehensive metabolic panel    Lipid  panel  5. Hyperglycemia  R73.9 Hemoglobin A1c  6. Screening for gonorrhea  Z11.3 Urine cytology ancillary only    Urine cytology ancillary only  7. Screening for chlamydial disease  Z11.8 Urine cytology ancillary only    Urine cytology ancillary only  8. Screening for HIV (human immunodeficiency virus)  Z11.4 HIV Antibody (routine testing w rflx)  9. Screening examination for venereal disease  Z11.3 RPR   Meds ordered this encounter  Medications  . chlorthalidone (HYGROTON) 25 MG tablet    Sig: Take 1 tablet (25 mg total) by mouth daily.    Dispense:  90 tablet    Refill:  3   Return precautions advised.   Tana ConchStephen Jenie Parish, MD

## 2019-09-03 NOTE — Addendum Note (Signed)
Addended by: Jasper Loser on: 09/03/2019 07:25 PM   Modules accepted: Orders

## 2019-09-04 ENCOUNTER — Encounter: Payer: Self-pay | Admitting: Gastroenterology

## 2019-09-04 LAB — LIPID PANEL
Cholesterol: 234 mg/dL — ABNORMAL HIGH (ref 0–200)
HDL: 40 mg/dL (ref >39.00)
LDL Cholesterol: 158 mg/dL — ABNORMAL HIGH (ref 0–99)
NonHDL: 193.7
Total CHOL/HDL Ratio: 6
Triglycerides: 178 mg/dL — ABNORMAL HIGH (ref 0.0–149.0)
VLDL: 35.6 mg/dL (ref 0.0–40.0)

## 2019-09-04 LAB — CBC
HCT: 46.3 % (ref 39.0–52.0)
Hemoglobin: 16.4 g/dL (ref 13.0–17.0)
MCHC: 35.5 g/dL (ref 30.0–36.0)
MCV: 81.5 fl (ref 78.0–100.0)
Platelets: 324 10*3/uL (ref 150.0–400.0)
RBC: 5.68 Mil/uL (ref 4.22–5.81)
RDW: 13.9 % (ref 11.5–15.5)
WBC: 6.8 10*3/uL (ref 4.0–10.5)

## 2019-09-04 LAB — COMPREHENSIVE METABOLIC PANEL
ALT: 53 U/L (ref 0–53)
AST: 34 U/L (ref 0–37)
Albumin: 4.7 g/dL (ref 3.5–5.2)
Alkaline Phosphatase: 74 U/L (ref 39–117)
BUN: 7 mg/dL (ref 6–23)
CO2: 30 mEq/L (ref 19–32)
Calcium: 10 mg/dL (ref 8.4–10.5)
Chloride: 100 mEq/L (ref 96–112)
Creatinine, Ser: 0.9 mg/dL (ref 0.40–1.50)
GFR: 109.78 mL/min (ref >60.00)
Glucose, Bld: 88 mg/dL (ref 70–99)
Potassium: 4 mEq/L (ref 3.5–5.1)
Sodium: 138 mEq/L (ref 135–145)
Total Bilirubin: 1.6 mg/dL — ABNORMAL HIGH (ref 0.2–1.2)
Total Protein: 7.4 g/dL (ref 6.0–8.3)

## 2019-09-04 LAB — HEMOGLOBIN A1C: Hgb A1c MFr Bld: 6.9 % — ABNORMAL HIGH (ref 4.6–6.5)

## 2019-09-04 LAB — RPR: RPR Ser Ql: NONREACTIVE

## 2019-09-04 LAB — PSA: PSA: 2.6 ng/mL (ref 0.10–4.00)

## 2019-09-04 LAB — HIV ANTIBODY (ROUTINE TESTING W REFLEX): HIV 1&2 Ab, 4th Generation: NONREACTIVE

## 2019-09-05 LAB — URINE CYTOLOGY ANCILLARY ONLY
Chlamydia: NEGATIVE
Neisseria Gonorrhea: NEGATIVE
Trichomonas: NEGATIVE

## 2019-09-07 ENCOUNTER — Other Ambulatory Visit: Payer: Self-pay

## 2019-09-07 MED ORDER — METFORMIN HCL 500 MG PO TABS: 500.0000 mg | ORAL_TABLET | Freq: Every day | ORAL | 3 refills | Status: DC

## 2019-09-11 ENCOUNTER — Encounter: Payer: Self-pay | Admitting: Family Medicine

## 2019-10-13 ENCOUNTER — Ambulatory Visit: Payer: Managed Care, Other (non HMO) | Admitting: Gastroenterology

## 2019-10-13 NOTE — Progress Notes (Deleted)
   HPI :    Past Medical History:  Diagnosis Date  . Herpes   . Hypertension      Past Surgical History:  Procedure Laterality Date  . none     Family History  Problem Relation Age of Onset  . Diabetes Mother   . Kidney disease Brother        transplant   Social History   Tobacco Use  . Smoking status: Never Smoker  . Smokeless tobacco: Never Used  Substance Use Topics  . Alcohol use: Yes    Alcohol/week: 1.0 standard drinks    Types: 1 Standard drinks or equivalent per week    Comment: max 3.   . Drug use: No   Current Outpatient Medications  Medication Sig Dispense Refill  . amLODipine (NORVASC) 10 MG tablet Take 1 tablet (10 mg total) by mouth daily. 90 tablet 3  . chlorthalidone (HYGROTON) 25 MG tablet Take 1 tablet (25 mg total) by mouth daily. 90 tablet 3  . etodolac (LODINE) 300 MG capsule Take 1 capsule (300 mg total) by mouth every 8 (eight) hours as needed. 30 capsule 2  . fluticasone (FLONASE) 50 MCG/ACT nasal spray Place 2 sprays into both nostrils daily. 48 g 3  . metFORMIN (GLUCOPHAGE) 500 MG tablet Take 1 tablet (500 mg total) by mouth daily with breakfast. 90 tablet 3  . triamcinolone cream (KENALOG) 0.1 % Apply 1 application topically 2 (two) times daily. 10 days maximum for eczema flares 80 g 2  . valACYclovir (VALTREX) 500 MG tablet Take 1 tablet (500 mg total) by mouth 2 (two) times daily. Prn for 3 days with flare up. 30 tablet 2   No current facility-administered medications for this visit.    Allergies  Allergen Reactions  . Lisinopril     Vertigo thought potentially caused by lisinopril- resolved off med     Review of Systems: All systems reviewed and negative except where noted in HPI.    No results found.  Physical Exam: There were no vitals taken for this visit. Constitutional: Pleasant,well-developed, ***male in no acute distress. HEENT: Normocephalic and atraumatic. Conjunctivae are normal. No scleral icterus. Neck supple.   Cardiovascular: Normal rate, regular rhythm.  Pulmonary/chest: Effort normal and breath sounds normal. No wheezing, rales or rhonchi. Abdominal: Soft, nondistended, nontender. Bowel sounds active throughout. There are no masses palpable. No hepatomegaly. Extremities: no edema Lymphadenopathy: No cervical adenopathy noted. Neurological: Alert and oriented to person place and time. Skin: Skin is warm and dry. No rashes noted. Psychiatric: Normal mood and affect. Behavior is normal.   ASSESSMENT AND PLAN:  [name] Olp, MD

## 2020-01-07 ENCOUNTER — Other Ambulatory Visit: Payer: Self-pay

## 2020-01-08 ENCOUNTER — Encounter: Payer: Self-pay | Admitting: Family Medicine

## 2020-01-08 ENCOUNTER — Ambulatory Visit: Payer: Managed Care, Other (non HMO) | Admitting: Family Medicine

## 2020-01-08 VITALS — BP 138/88 | HR 94 | Temp 98.1°F | Ht 69.0 in | Wt 224.2 lb

## 2020-01-08 DIAGNOSIS — E669 Obesity, unspecified: Secondary | ICD-10-CM | POA: Diagnosis not present

## 2020-01-08 DIAGNOSIS — I10 Essential (primary) hypertension: Secondary | ICD-10-CM

## 2020-01-08 DIAGNOSIS — R739 Hyperglycemia, unspecified: Secondary | ICD-10-CM

## 2020-01-08 DIAGNOSIS — N529 Male erectile dysfunction, unspecified: Secondary | ICD-10-CM

## 2020-01-08 MED ORDER — SILDENAFIL CITRATE 100 MG PO TABS: 100.0000 mg | ORAL_TABLET | Freq: Every day | ORAL | 11 refills | Status: DC | PRN

## 2020-01-08 NOTE — Patient Instructions (Addendum)
Thanks for doing labs. If a1c above 6.4 diagnose as diabetes- hoping for a good result. Blood pressure looks better- continue to monitor salt intake and minimize as able.   Lets focus on at least 5 lbs weight loss by follow up- more is even better. Lets exercise 30 minutes at least 3 days a week with goal of 5.   Recommended follow up: 6 months as long as a1c under 7. If above 7 consider 4 month follow up

## 2020-01-08 NOTE — Assessment & Plan Note (Signed)
S: compliant with amlodipine 10 mg, chlorthalidone 25 mg-which was added September 03, 2019  Not recently having to use etodolac. Encouraged low salt diet.  BP Readings from Last 3 Encounters:  01/08/20 138/88  09/03/19 (!) 160/96  08/03/19 (!) 160/104  A/P: Much improved control-still would like to see numbers lower and encourage lifestyle changes but we will not add medicine at this time

## 2020-01-08 NOTE — Progress Notes (Signed)
Phone [phone] In person visit   Subjective:   [name] [name] is a 49 y.o. year old very pleasant male patient who presents for/with See problem oriented charting Chief Complaint  Patient presents with  . Hypertension    follow up  . Blood Sugar Problem    follow up    This visit occurred during the SARS-CoV-2 public health emergency.  Safety protocols were in place, including screening questions prior to the visit, additional usage of staff PPE, and extensive cleaning of exam room while observing appropriate contact time as indicated for disinfecting solutions.   Past Medical History-  Patient Active Problem List   Diagnosis Date Noted  . Low back pain without sciatica 10/24/2018    Priority: Medium  . Hyperglycemia 09/16/2015    Priority: Medium  . Hyperlipidemia 09/16/2015    Priority: Medium  . Hypertension 09/09/2014    Priority: Medium  . Benign paroxysmal positional vertigo 11/03/2016    Priority: Low  . Genital herpes 09/09/2014    Priority: Low  . Erectile dysfunction 01/08/2020  . Atopic dermatitis 10/15/2017    Medications- reviewed and updated Current Outpatient Medications  Medication Sig Dispense Refill  . amLODipine (NORVASC) 10 MG tablet Take 1 tablet (10 mg total) by mouth daily. 90 tablet 3  . chlorthalidone (HYGROTON) 25 MG tablet Take 1 tablet (25 mg total) by mouth daily. 90 tablet 3  . etodolac (LODINE) 300 MG capsule Take 1 capsule (300 mg total) by mouth every 8 (eight) hours as needed. 30 capsule 2  . fluticasone (FLONASE) 50 MCG/ACT nasal spray Place 2 sprays into both nostrils daily. 48 g 3  . metFORMIN (GLUCOPHAGE) 500 MG tablet Take 1 tablet (500 mg total) by mouth daily with breakfast. 90 tablet 3  . triamcinolone cream (KENALOG) 0.1 % Apply 1 application topically 2 (two) times daily. 10 days maximum for eczema flares 80 g 2  . valACYclovir (VALTREX) 500 MG tablet Take 1 tablet (500 mg total) by mouth 2 (two) times daily. Prn for 3  days with flare up. 30 tablet 2  . sildenafil (VIAGRA) 100 MG tablet Take 1 tablet (100 mg total) by mouth daily as needed for erectile dysfunction. 10 tablet 11   No current facility-administered medications for this visit.     Objective:  BP 138/88   Pulse 94   Temp 98.1 F (36.7 C) (Temporal)   Ht 5\' 9"  (1.753 m)   Wt 224 lb 3.2 oz (101.7 kg)   SpO2 95%   BMI 33.11 kg/m  CV: RRR no murmurs rubs or gallops Lungs: CTAB no crackles, wheeze, rhonchi Ext: no edema    Assessment and Plan   #hypertension S: compliant with amlodipine 10 mg, chlorthalidone 25 mg-which was added September 03, 2019  Not recently having to use etodolac. Encouraged low salt diet.  BP Readings from Last 3 Encounters:  01/08/20 138/88  09/03/19 (!) 160/96  08/03/19 (!) 160/104  A/P: Much improved control-still would like to see numbers lower and encourage lifestyle changes but we will not add medicine at this time  # Hyperglycemia/insulin resistance/prediabetes/obesity S: Exercise and diet- has cut down on sugary foods- cut out some candies at wrok Has not felt like he has done well on exercise (covid barrier). Has increased fruits and veggies- feels like needs to cut down on french fries and other eating out. No weight loss yet.   Tolerating metformin once a day at 500 mg Lab Results  Component Value Date   HGBA1C  6.9 (H) 09/03/2019   HGBA1C 5.8 10/24/2018   HGBA1C 5.0 06/08/2015   Wt Readings from Last 3 Encounters:  01/08/20 224 lb 3.2 oz (101.7 kg)  09/03/19 224 lb 3.2 oz (101.7 kg)  08/03/19 225 lb 9.6 oz (102.3 kg)    A/P: hopefully a1c under 6.5. if 6.5 or above would have to diagnose as diabetes at this point. As long as remains below 7 can stay on current dose and continue to work on lifestyle.  For obesity- Encouraged need for healthy eating, regular exercise, weight loss.  Goal at least 5 pounds off to follow-up  -We also discussed if new diabetes diagnosis trial of Rosuvastatin 10  mg weekly   #Erectile dysfunction S: Patient reports issues obtaining and maintaining firm erection.  Has tried Viagra in the past and found this helpful apparently that he received from a friend (advised against this) -asks if I will prescribe A/P: New diagnosis erectile dysfunction-I do think a trial of Viagra is reasonable.  Start out with half tablet of the 100 mg tablet sent in.  He has no chest pain or shortness of breath with activities I think this is reasonable   Recommended follow up: 80-month follow-up discussed.  Next physical will be due after September 10 Future Appointments  Date Time Provider Department Center  07/22/2020  3:00 PM Shelva Majestic, MD [facility]    Lab/Order associations:   ICD-10-CM   1. Essential hypertension  I10 CMET future    CMET future    CANCELED: CMET future  2. Obesity (BMI 30-39.9)  E66.9   3. Hyperglycemia  R73.9 A1c future    A1c future    CANCELED: A1c future  4. Erectile dysfunction, unspecified erectile dysfunction type  N52.9     Meds ordered this encounter  Medications  . sildenafil (VIAGRA) 100 MG tablet    Sig: Take 1 tablet (100 mg total) by mouth daily as needed for erectile dysfunction.    Dispense:  10 tablet    Refill:  11    Return precautions advised.  Tana Conch, MD

## 2020-01-08 NOTE — Assessment & Plan Note (Addendum)
S: Patient reports issues obtaining and maintaining firm erection.  Has tried Viagra in the past and found this helpful apparently that he received from a friend (advised against this) -asks if I will prescribe A/P: New diagnosis erectile dysfunction-I do think a trial of Viagra is reasonable.  Start out with half tablet of the 100 mg tablet sent in.  He has no chest pain or shortness of breath with activities I think this is reasonable

## 2020-01-08 NOTE — Assessment & Plan Note (Signed)
#   Hyperglycemia/insulin resistance/prediabetes/obesity S: Exercise and diet- has cut down on sugary foods- cut out some candies at wrok Has not felt like he has done well on exercise (covid barrier). Has increased fruits and veggies- feels like needs to cut down on french fries and other eating out. No weight loss yet.   Tolerating metformin once a day at 500 mg Lab Results  Component Value Date   HGBA1C 6.9 (H) 09/03/2019   HGBA1C 5.8 10/24/2018   HGBA1C 5.0 06/08/2015   Wt Readings from Last 3 Encounters:  01/08/20 224 lb 3.2 oz (101.7 kg)  09/03/19 224 lb 3.2 oz (101.7 kg)  08/03/19 225 lb 9.6 oz (102.3 kg)    A/P: hopefully a1c under 6.5. if 6.5 or above would have to diagnose as diabetes at this point. As long as remains below 7 can stay on current dose and continue to work on lifestyle.  For obesity- Encouraged need for healthy eating, regular exercise, weight loss.  Goal at least 5 pounds off to follow-up  -We also discussed if new diabetes diagnosis trial of Rosuvastatin 10 mg weekly

## 2020-01-09 LAB — COMPREHENSIVE METABOLIC PANEL
AG Ratio: 1.8 (calc) (ref 1.0–2.5)
ALT: 59 U/L — ABNORMAL HIGH (ref 9–46)
AST: 35 U/L (ref 10–40)
Albumin: 4.9 g/dL (ref 3.6–5.1)
Alkaline phosphatase (APISO): 72 U/L (ref 36–130)
BUN: 13 mg/dL (ref 7–25)
CO2: 28 mmol/L (ref 20–32)
Calcium: 9.7 mg/dL (ref 8.6–10.3)
Chloride: 99 mmol/L (ref 98–110)
Creat: 1.03 mg/dL (ref 0.60–1.35)
Globulin: 2.8 g/dL (calc) (ref 1.9–3.7)
Glucose, Bld: 89 mg/dL (ref 65–99)
Potassium: 3.4 mmol/L — ABNORMAL LOW (ref 3.5–5.3)
Sodium: 140 mmol/L (ref 135–146)
Total Bilirubin: 1.4 mg/dL — ABNORMAL HIGH (ref 0.2–1.2)
Total Protein: 7.7 g/dL (ref 6.1–8.1)

## 2020-01-09 LAB — HEMOGLOBIN A1C
Hgb A1c MFr Bld: 6.3 % of total Hgb — ABNORMAL HIGH (ref <5.7)
Mean Plasma Glucose: 134 (calc)
eAG (mmol/L): 7.4 (calc)

## 2020-04-01 ENCOUNTER — Ambulatory Visit: Payer: Managed Care, Other (non HMO) | Attending: Internal Medicine

## 2020-04-01 DIAGNOSIS — Z23 Encounter for immunization: Secondary | ICD-10-CM

## 2020-04-01 NOTE — Progress Notes (Signed)
   Covid-19 Vaccination Clinic  Name:  [name] [name]    MRN: [id number] DOB: [birth year]  04/01/2020  [name] [name] was observed post Covid-19 immunization for 15 minutes without incident. He was provided with Vaccine Information Sheet and instruction to access the V-Safe system.   [name] [name] was instructed to call 911 with any severe reactions post vaccine: Marland Kitchen Difficulty breathing  . Swelling of face and throat  . A fast heartbeat  . A bad rash all over body  . Dizziness and weakness   Immunizations Administered    Name Date Dose VIS Date Route   Pfizer COVID-19 Vaccine 04/01/2020  3:15 PM 0.3 mL 12/04/2019 Intramuscular   Manufacturer: [employer], [employer]   Lot: [id number]   NDC: [address]-5

## 2020-04-27 ENCOUNTER — Ambulatory Visit: Payer: Managed Care, Other (non HMO) | Attending: Internal Medicine

## 2020-04-27 DIAGNOSIS — Z23 Encounter for immunization: Secondary | ICD-10-CM

## 2020-04-27 NOTE — Progress Notes (Signed)
   Covid-19 Vaccination Clinic  Name:  [name] [name]    MRN: [id number] DOB: 1971/01/20  04/27/2020  [name] was observed post Covid-19 immunization for 15 minutes without incident. He was provided with Vaccine Information Sheet and instruction to access the V-Safe system.   [name] was instructed to call 911 with any severe reactions post vaccine: Marland Kitchen Difficulty breathing  . Swelling of face and throat  . A fast heartbeat  . A bad rash all over body  . Dizziness and weakness   Immunizations Administered    Name Date Dose VIS Date Route   Pfizer COVID-19 Vaccine 04/27/2020  4:02 PM 0.3 mL 02/17/2019 Intramuscular   Manufacturer: [employer], [employer]   Lot: [id number]   NDC: [address]-1

## 2020-07-22 ENCOUNTER — Ambulatory Visit: Payer: Managed Care, Other (non HMO) | Admitting: Family Medicine

## 2020-07-22 NOTE — Progress Notes (Deleted)
  Phone [phone] In person visit   Subjective:   [name] [name] is a 50 y.o. year old very pleasant male patient who presents for/with See problem oriented charting No chief complaint on file.   This visit occurred during the SARS-CoV-2 public health emergency.  Safety protocols were in place, including screening questions prior to the visit, additional usage of staff PPE, and extensive cleaning of exam room while observing appropriate contact time as indicated for disinfecting solutions.   Past Medical History-  Patient Active Problem List   Diagnosis Date Noted  . Erectile dysfunction 01/08/2020  . Low back pain without sciatica 10/24/2018  . Atopic dermatitis 10/15/2017  . Benign paroxysmal positional vertigo 11/03/2016  . Hyperglycemia 09/16/2015  . Hyperlipidemia 09/16/2015  . Hypertension 09/09/2014  . Genital herpes 09/09/2014    Medications- reviewed and updated Current Outpatient Medications  Medication Sig Dispense Refill  . amLODipine (NORVASC) 10 MG tablet Take 1 tablet (10 mg total) by mouth daily. 90 tablet 3  . chlorthalidone (HYGROTON) 25 MG tablet Take 1 tablet (25 mg total) by mouth daily. 90 tablet 3  . etodolac (LODINE) 300 MG capsule Take 1 capsule (300 mg total) by mouth every 8 (eight) hours as needed. 30 capsule 2  . fluticasone (FLONASE) 50 MCG/ACT nasal spray Place 2 sprays into both nostrils daily. 48 g 3  . metFORMIN (GLUCOPHAGE) 500 MG tablet Take 1 tablet (500 mg total) by mouth daily with breakfast. 90 tablet 3  . sildenafil (VIAGRA) 100 MG tablet Take 1 tablet (100 mg total) by mouth daily as needed for erectile dysfunction. 10 tablet 11  . triamcinolone cream (KENALOG) 0.1 % Apply 1 application topically 2 (two) times daily. 10 days maximum for eczema flares 80 g 2  . valACYclovir (VALTREX) 500 MG tablet Take 1 tablet (500 mg total) by mouth 2 (two) times daily. Prn for 3 days with flare up. 30 tablet 2   No current facility-administered  medications for this visit.     Objective:  There were no vitals taken for this visit. Gen: NAD, resting comfortably CV: RRR no murmurs rubs or gallops Lungs: CTAB no crackles, wheeze, rhonchi Abdomen: soft/nontender/nondistended/normal bowel sounds. No rebound or guarding.  Ext: no edema Skin: warm, dry Neuro: grossly normal, moves all extremities  ***    Assessment and Plan  *** No specialty comments available.  No problem-specific Assessment & Plan notes found for this encounter.   Recommended follow up: ***No follow-ups on file. Future Appointments  Date Time Provider Department Center  07/22/2020  3:00 PM Shelva Majestic, MD [facility]    Lab/Order associations: No diagnosis found.  No orders of the defined types were placed in this encounter.   Time Spent: *** minutes of total time (8:36 AM***- 8:36 AM***) was spent on the date of the encounter performing the following actions: chart review prior to seeing the patient, obtaining history, performing a medically necessary exam, counseling on the treatment plan, placing orders, and documenting in our EHR.   Return precautions advised.  Donnamarie Poag, CMA

## 2020-09-23 ENCOUNTER — Other Ambulatory Visit: Payer: Self-pay | Admitting: Family Medicine

## 2020-09-30 ENCOUNTER — Other Ambulatory Visit: Payer: Self-pay | Admitting: Family Medicine

## 2020-10-04 ENCOUNTER — Encounter: Payer: Self-pay | Admitting: Family Medicine

## 2020-10-04 ENCOUNTER — Other Ambulatory Visit: Payer: Self-pay

## 2020-10-04 ENCOUNTER — Ambulatory Visit: Payer: Managed Care, Other (non HMO) | Admitting: Family Medicine

## 2020-10-04 VITALS — BP 132/70 | HR 93 | Temp 98.2°F | Resp 18 | Ht 69.0 in | Wt 220.2 lb

## 2020-10-04 DIAGNOSIS — R739 Hyperglycemia, unspecified: Secondary | ICD-10-CM

## 2020-10-04 DIAGNOSIS — E785 Hyperlipidemia, unspecified: Secondary | ICD-10-CM | POA: Diagnosis not present

## 2020-10-04 DIAGNOSIS — L2089 Other atopic dermatitis: Secondary | ICD-10-CM

## 2020-10-04 DIAGNOSIS — I10 Essential (primary) hypertension: Secondary | ICD-10-CM

## 2020-10-04 DIAGNOSIS — Z23 Encounter for immunization: Secondary | ICD-10-CM

## 2020-10-04 DIAGNOSIS — Z1159 Encounter for screening for other viral diseases: Secondary | ICD-10-CM | POA: Diagnosis not present

## 2020-10-04 DIAGNOSIS — Z1211 Encounter for screening for malignant neoplasm of colon: Secondary | ICD-10-CM

## 2020-10-04 MED ORDER — TRIAMCINOLONE ACETONIDE 0.1 % EX CREA: 1.0000 "application " | TOPICAL_CREAM | Freq: Two times a day (BID) | CUTANEOUS | 2 refills | Status: DC

## 2020-10-04 NOTE — Progress Notes (Signed)
Phone [phone] In person visit   Subjective:   [name] [name] is a 49 y.o. year old very pleasant male patient who presents for/with See problem oriented charting Chief Complaint  Patient presents with  . Hypertension  . Hyperglycemia   This visit occurred during the SARS-CoV-2 public health emergency.  Safety protocols were in place, including screening questions prior to the visit, additional usage of staff PPE, and extensive cleaning of exam room while observing appropriate contact time as indicated for disinfecting solutions.   Past Medical History-  Patient Active Problem List   Diagnosis Date Noted  . Low back pain without sciatica 10/24/2018    Priority: Medium  . Atopic dermatitis 10/15/2017    Priority: Medium  . Hyperglycemia 09/16/2015    Priority: Medium  . Hyperlipidemia 09/16/2015    Priority: Medium  . Hypertension 09/09/2014    Priority: Medium  . Benign paroxysmal positional vertigo 11/03/2016    Priority: Low  . Genital herpes 09/09/2014    Priority: Low  . Erectile dysfunction 01/08/2020    Medications- reviewed and updated Current Outpatient Medications  Medication Sig Dispense Refill  . amLODipine (NORVASC) 10 MG tablet TAKE 1 TABLET BY MOUTH EVERY DAY 90 tablet 0  . chlorthalidone (HYGROTON) 25 MG tablet TAKE 1 TABLET BY MOUTH EVERY DAY 90 tablet 0  . etodolac (LODINE) 300 MG capsule Take 1 capsule (300 mg total) by mouth every 8 (eight) hours as needed. 30 capsule 2  . fluticasone (FLONASE) 50 MCG/ACT nasal spray Place 2 sprays into both nostrils daily. 48 g 3  . metFORMIN (GLUCOPHAGE) 500 MG tablet TAKE 1 TABLET BY MOUTH EVERY DAY WITH BREAKFAST 90 tablet 3  . sildenafil (VIAGRA) 100 MG tablet Take 1 tablet (100 mg total) by mouth daily as needed for erectile dysfunction. 10 tablet 11  . triamcinolone cream (KENALOG) 0.1 % Apply 1 application topically 2 (two) times daily. 10 days maximum for eczema flares 80 g 2  . valACYclovir (VALTREX)  500 MG tablet Take 1 tablet (500 mg total) by mouth 2 (two) times daily. Prn for 3 days with flare up. 30 tablet 2   No current facility-administered medications for this visit.     Objective:  BP 132/70   Pulse 93   Temp 98.2 F (36.8 C) (Temporal)   Resp 18   Ht 5\' 9"  (1.753 m)   Wt 220 lb 3.2 oz (99.9 kg)   SpO2 98%   BMI 32.52 kg/m  Gen: NAD, resting comfortably CV: RRR no murmurs rubs or gallops Lungs: CTAB no crackles, wheeze, rhonchi Ext: trace edema Skin: warm, dry     Assessment and Plan   # Hyperglycemia/insulin resistance/prediabetes S:  Medication: metformin 500mg  Exercise and diet- down 4 lbs- exercise still lacking. Reasonable job on diet- has improved some and lost a few lbs  Lab Results  Component Value Date   HGBA1C 6.3 (H) 01/08/2020   HGBA1C 6.9 (H) 09/03/2019   HGBA1C 5.8 10/24/2018   A/P: hopefully remains 6.4 or less- we discussed if above this would have to diagnose diabetes. Continue metformin   #hypertension S: medication: amlodipine 10Mg , Chlorthalidone 25Mg  BP Readings from Last 3 Encounters:  10/04/20 132/70  01/08/20 138/88  09/03/19 (!) 160/96  A/P: Stable. Continue current medications.   #hyperlipidemia S: Medication:none Lab Results  Component Value Date   CHOL 234 (H) 09/03/2019   HDL 40.00 09/03/2019   LDLCALC 158 (H) 09/03/2019   TRIG 178.0 (H) 09/03/2019   CHOLHDL 6 09/03/2019  A/P: 10-year risk of heart attack or stroke based on the ASCVD risk estimator is 9% based off last year's labs-he has lost some weight since last visit and would like to recalculate this by getting updated lipid panel. We discussed coronary calcium scoring versus starting medicine versus focusing on diet and exercise and he prefers to focus on diet and exercise before starting medicine-perhaps use 12% cut off  #Atopic dermatitis-doing well with intermittent triamcinolone-requests refill today.  This was filled  Recommended follow up: Return in about  6 months (around 04/04/2021) for physical or sooner if needed.  Lab/Order associations:   ICD-10-CM   1. Primary hypertension  I10   2. Hyperlipidemia, unspecified hyperlipidemia type  E78.5 CBC With Differential/Platelet    COMPLETE METABOLIC PANEL WITH GFR    Lipid Panel (Refl)    Lipid Panel (Refl)    COMPLETE METABOLIC PANEL WITH GFR    CBC With Differential/Platelet  3. Encounter for hepatitis C screening test for low risk patient  Z11.59 Hepatitis C antibody    Hepatitis C antibody  4. Hyperglycemia  R73.9 Hemoglobin A1c    Hemoglobin A1c  5. Screen for colon cancer  Z12.11 Ambulatory referral to Gastroenterology  6. Other atopic dermatitis  L20.89 triamcinolone cream (KENALOG) 0.1 %    Meds ordered this encounter  Medications  . triamcinolone cream (KENALOG) 0.1 %    Sig: Apply 1 application topically 2 (two) times daily. 10 days maximum for eczema flares    Dispense:  80 g    Refill:  2   Return precautions advised.  Tana Conch, MD

## 2020-10-04 NOTE — Patient Instructions (Addendum)
Health Maintenance Due  Topic Date Due  . INFLUENZA VACCINE flu shot today 07/24/2020   We will call you within two weeks about your referral to GI. If you do not hear within 3 weeks, give [place] a call.   Thanks for doing labs If you have mychart- we will send your results within 3 business days of [place] receiving them.  If you do not have mychart- we will call you about results within 5 business days of [place] receiving them.  *please note we are currently using [facility] which has a longer processing time than [place] typically so labs may not come back as quickly as in the past *please also note that you will see labs on mychart as soon as they post. I will later go in and write notes on them- will say "notes from Dr. Durene Cal"

## 2020-10-05 LAB — COMPLETE METABOLIC PANEL WITH GFR
AG Ratio: 1.6 (calc) (ref 1.0–2.5)
ALT: 34 U/L (ref 9–46)
AST: 22 U/L (ref 10–40)
Albumin: 4.5 g/dL (ref 3.6–5.1)
Alkaline phosphatase (APISO): 68 U/L (ref 36–130)
BUN: 9 mg/dL (ref 7–25)
CO2: 24 mmol/L (ref 20–32)
Calcium: 9.8 mg/dL (ref 8.6–10.3)
Chloride: 103 mmol/L (ref 98–110)
Creat: 0.91 mg/dL (ref 0.60–1.35)
GFR, Est African American: 116 mL/min/{1.73_m2} (ref >=60)
GFR, Est Non African American: 100 mL/min/{1.73_m2} (ref >=60)
Globulin: 2.8 g/dL (calc) (ref 1.9–3.7)
Glucose, Bld: 120 mg/dL — ABNORMAL HIGH (ref 65–99)
Potassium: 3.7 mmol/L (ref 3.5–5.3)
Sodium: 138 mmol/L (ref 135–146)
Total Bilirubin: 0.9 mg/dL (ref 0.2–1.2)
Total Protein: 7.3 g/dL (ref 6.1–8.1)

## 2020-10-05 LAB — HEMOGLOBIN A1C
Hgb A1c MFr Bld: 5.9 % of total Hgb — ABNORMAL HIGH (ref <5.7)
Mean Plasma Glucose: 123 (calc)
eAG (mmol/L): 6.8 (calc)

## 2020-10-05 LAB — CBC WITH DIFFERENTIAL/PLATELET
Absolute Monocytes: 391 cells/uL (ref 200–950)
Basophils Absolute: 12 cells/uL (ref 0–200)
Basophils Relative: 0.2 %
Eosinophils Absolute: 118 cells/uL (ref 15–500)
Eosinophils Relative: 1.9 %
HCT: 45.3 % (ref 38.5–50.0)
Hemoglobin: 15.9 g/dL (ref 13.2–17.1)
Lymphs Abs: 2747 cells/uL (ref 850–3900)
MCH: 28.8 pg (ref 27.0–33.0)
MCHC: 35.1 g/dL (ref 32.0–36.0)
MCV: 81.9 fL (ref 80.0–100.0)
MPV: 10.8 fL (ref 7.5–12.5)
Monocytes Relative: 6.3 %
Neutro Abs: 2933 cells/uL (ref 1500–7800)
Neutrophils Relative %: 47.3 %
Platelets: 392 10*3/uL (ref 140–400)
RBC: 5.53 10*6/uL (ref 4.20–5.80)
RDW: 13.3 % (ref 11.0–15.0)
Total Lymphocyte: 44.3 %
WBC: 6.2 10*3/uL (ref 3.8–10.8)

## 2020-10-05 LAB — LIPID PANEL (REFL)
Cholesterol: 210 mg/dL — ABNORMAL HIGH (ref <200)
HDL: 41 mg/dL (ref >=40)
LDL Cholesterol (Calc): 135 mg/dL (calc) — ABNORMAL HIGH
Non-HDL Cholesterol (Calc): 169 mg/dL (calc) — ABNORMAL HIGH (ref <130)
Total CHOL/HDL Ratio: 5.1 (calc) — ABNORMAL HIGH (ref <5.0)
Triglycerides: 195 mg/dL — ABNORMAL HIGH (ref <150)

## 2020-10-05 LAB — HEPATITIS C ANTIBODY
Hepatitis C Ab: NONREACTIVE
SIGNAL TO CUT-OFF: 0.04 (ref <1.00)

## 2020-12-21 ENCOUNTER — Other Ambulatory Visit: Payer: Self-pay | Admitting: Family Medicine

## 2021-03-15 ENCOUNTER — Other Ambulatory Visit: Payer: Self-pay | Admitting: Family Medicine

## 2021-04-04 ENCOUNTER — Encounter: Payer: Managed Care, Other (non HMO) | Admitting: Family Medicine

## 2021-05-25 ENCOUNTER — Ambulatory Visit (INDEPENDENT_AMBULATORY_CARE_PROVIDER_SITE_OTHER): Payer: 59 | Admitting: Family Medicine

## 2021-05-25 ENCOUNTER — Other Ambulatory Visit (HOSPITAL_COMMUNITY)
Admission: RE | Admit: 2021-05-25 | Discharge: 2021-05-25 | Disposition: A | Discharge status: Home / Self Care | Payer: 59 | Source: Ambulatory Visit | Attending: Family Medicine | Admitting: Family Medicine

## 2021-05-25 ENCOUNTER — Other Ambulatory Visit: Payer: Self-pay

## 2021-05-25 ENCOUNTER — Encounter: Payer: Self-pay | Admitting: Family Medicine

## 2021-05-25 VITALS — BP 134/86 | HR 86 | Temp 97.9°F | Ht 69.0 in | Wt 222.4 lb

## 2021-05-25 DIAGNOSIS — Z0000 Encounter for general adult medical examination without abnormal findings: Secondary | ICD-10-CM | POA: Diagnosis not present

## 2021-05-25 DIAGNOSIS — I10 Essential (primary) hypertension: Secondary | ICD-10-CM | POA: Diagnosis not present

## 2021-05-25 DIAGNOSIS — Z118 Encounter for screening for other infectious and parasitic diseases: Secondary | ICD-10-CM | POA: Insufficient documentation

## 2021-05-25 DIAGNOSIS — Z114 Encounter for screening for human immunodeficiency virus [HIV]: Secondary | ICD-10-CM

## 2021-05-25 DIAGNOSIS — R739 Hyperglycemia, unspecified: Secondary | ICD-10-CM

## 2021-05-25 DIAGNOSIS — Z113 Encounter for screening for infections with a predominantly sexual mode of transmission: Secondary | ICD-10-CM | POA: Diagnosis not present

## 2021-05-25 DIAGNOSIS — Z125 Encounter for screening for malignant neoplasm of prostate: Secondary | ICD-10-CM | POA: Diagnosis not present

## 2021-05-25 DIAGNOSIS — B009 Herpesviral infection, unspecified: Secondary | ICD-10-CM | POA: Diagnosis not present

## 2021-05-25 DIAGNOSIS — E785 Hyperlipidemia, unspecified: Secondary | ICD-10-CM | POA: Diagnosis not present

## 2021-05-25 DIAGNOSIS — L2089 Other atopic dermatitis: Secondary | ICD-10-CM

## 2021-05-25 LAB — PSA: PSA: 3.31 ng/mL (ref 0.10–4.00)

## 2021-05-25 LAB — CBC WITH DIFFERENTIAL/PLATELET
Basophils Absolute: 0 10*3/uL (ref 0.0–0.1)
Basophils Relative: 0.3 % (ref 0.0–3.0)
Eosinophils Absolute: 0.1 10*3/uL (ref 0.0–0.7)
Eosinophils Relative: 2.9 % (ref 0.0–5.0)
HCT: 46.3 % (ref 39.0–52.0)
Hemoglobin: 16.3 g/dL (ref 13.0–17.0)
Lymphocytes Relative: 44.9 % (ref 12.0–46.0)
Lymphs Abs: 2.2 10*3/uL (ref 0.7–4.0)
MCHC: 35.3 g/dL (ref 30.0–36.0)
MCV: 81.6 fl (ref 78.0–100.0)
Monocytes Absolute: 0.3 10*3/uL (ref 0.1–1.0)
Monocytes Relative: 5.7 % (ref 3.0–12.0)
Neutro Abs: 2.2 10*3/uL (ref 1.4–7.7)
Neutrophils Relative %: 46.2 % (ref 43.0–77.0)
Platelets: 303 10*3/uL (ref 150.0–400.0)
RBC: 5.67 Mil/uL (ref 4.22–5.81)
RDW: 13.8 % (ref 11.5–15.5)
WBC: 4.8 10*3/uL (ref 4.0–10.5)

## 2021-05-25 LAB — POC URINALSYSI DIPSTICK (AUTOMATED)
Bilirubin, UA: NEGATIVE
Blood, UA: NEGATIVE
Glucose, UA: NEGATIVE
Ketones, UA: NEGATIVE
Leukocytes, UA: NEGATIVE
Nitrite, UA: NEGATIVE
Protein, UA: NEGATIVE
Spec Grav, UA: 1.015 (ref 1.010–1.025)
Urobilinogen, UA: 0.2 E.U./dL
pH, UA: 6.5 (ref 5.0–8.0)

## 2021-05-25 LAB — HEMOGLOBIN A1C: Hgb A1c MFr Bld: 6.8 % — ABNORMAL HIGH (ref 4.6–6.5)

## 2021-05-25 MED ORDER — SILDENAFIL CITRATE 100 MG PO TABS: 100.0000 mg | ORAL_TABLET | Freq: Every day | ORAL | 11 refills | Status: DC | PRN

## 2021-05-25 MED ORDER — VALACYCLOVIR HCL 500 MG PO TABS
500.0000 mg | ORAL_TABLET | Freq: Two times a day (BID) | ORAL | 2 refills | Status: DC
Start: 2021-05-25 — End: 2021-06-05

## 2021-05-25 MED ORDER — AMLODIPINE BESYLATE 10 MG PO TABS: 1.0000 | ORAL_TABLET | Freq: Every day | ORAL | 3 refills | Status: DC

## 2021-05-25 MED ORDER — CHLORTHALIDONE 25 MG PO TABS: 25.0000 mg | ORAL_TABLET | Freq: Every day | ORAL | 3 refills | Status: DC

## 2021-05-25 MED ORDER — METFORMIN HCL 500 MG PO TABS: ORAL_TABLET | ORAL | 3 refills | Status: DC

## 2021-05-25 MED ORDER — FLUTICASONE PROPIONATE 50 MCG/ACT NA SUSP: 2.0000 | Freq: Every day | NASAL | 3 refills | Status: DC

## 2021-05-25 MED ORDER — ETODOLAC 300 MG PO CAPS: 300.0000 mg | ORAL_CAPSULE | Freq: Three times a day (TID) | ORAL | 1 refills | Status: DC | PRN

## 2021-05-25 MED ORDER — TRIAMCINOLONE ACETONIDE 0.1 % EX CREA: 1.0000 "application " | TOPICAL_CREAM | Freq: Two times a day (BID) | CUTANEOUS | 2 refills | Status: DC

## 2021-05-25 NOTE — Progress Notes (Signed)
Phone: [phone]   Subjective:  Patient presents today for their annual physical. Chief complaint-noted.   See problem oriented charting- ROS- full  review of systems was completed and negative except for abscess on left lower buttocks which has gone down substantially  The following were reviewed and entered/updated in epic: Past Medical History:  Diagnosis Date  . Herpes   . Hypertension    Patient Active Problem List   Diagnosis Date Noted  . Low back pain without sciatica 10/24/2018    Priority: Medium  . Atopic dermatitis 10/15/2017    Priority: Medium  . Hyperglycemia 09/16/2015    Priority: Medium  . Hyperlipidemia 09/16/2015    Priority: Medium  . Hypertension 09/09/2014    Priority: Medium  . Benign paroxysmal positional vertigo 11/03/2016    Priority: Low  . Genital herpes 09/09/2014    Priority: Low  . Erectile dysfunction 01/08/2020   Past Surgical History:  Procedure Laterality Date  . none      Family History  Problem Relation Age of Onset  . Diabetes Mother   . Kidney disease Brother        transplant    Medications- reviewed and updated Current Outpatient Medications  Medication Sig Dispense Refill  . amLODipine (NORVASC) 10 MG tablet TAKE 1 TABLET BY MOUTH EVERY DAY 90 tablet 0  . chlorthalidone (HYGROTON) 25 MG tablet TAKE 1 TABLET BY MOUTH EVERY DAY 90 tablet 0  . etodolac (LODINE) 300 MG capsule Take 1 capsule (300 mg total) by mouth every 8 (eight) hours as needed. 30 capsule 2  . fluticasone (FLONASE) 50 MCG/ACT nasal spray Place 2 sprays into both nostrils daily. 48 g 3  . metFORMIN (GLUCOPHAGE) 500 MG tablet TAKE 1 TABLET BY MOUTH EVERY DAY WITH BREAKFAST 90 tablet 3  . sildenafil (VIAGRA) 100 MG tablet Take 1 tablet (100 mg total) by mouth daily as needed for erectile dysfunction. 10 tablet 11  . triamcinolone cream (KENALOG) 0.1 % Apply 1 application topically 2 (two) times daily. 10 days maximum for eczema flares 80 g 2  .  valACYclovir (VALTREX) 500 MG tablet Take 1 tablet (500 mg total) by mouth 2 (two) times daily. Prn for 3 days with flare up. 30 tablet 2   No current facility-administered medications for this visit.    Allergies-reviewed and updated Allergies  Allergen Reactions  . Lisinopril     Vertigo thought potentially caused by lisinopril- resolved off med    Social History   Social History Narrative   Married 2005 to [employer]. 1 son who is at [employer], [employer].       Now works with [employer].    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    BA from [employer].       Hobbies: gamer-call of duty, [employer], nba 2   Objective  Objective:  BP 134/86   Pulse 86   Temp 97.9 F (36.6 C) (Temporal)   Ht 5\' 9"  (1.753 m)   Wt 222 lb 6.4 oz (100.9 kg)   SpO2 98%   BMI 32.84 kg/m  Gen: NAD, resting comfortably HEENT: Mucous membranes are moist. Oropharynx normal. TMs normal. Nasal turbinates normal Neck: no thyromegaly, no carotid bruits, no cervical lymphadenopathy CV: RRR no murmurs rubs or gallops Lungs: CTAB no crackles, wheeze, rhonchi Abdomen: soft/nontender/nondistended/normal bowel sounds. No rebound or guarding.  Ext: no edema Skin:  warm, dry, on left lower buttocks there is a small healing abscess under 1 cm with no active fluctuance-recommended warm compresses if needed Neuro: grossly normal, moves all extremities, PERRLA     Assessment and Plan  50 y.o. male presenting for annual physical.  Health Maintenance counseling: 1. Anticipatory guidance: Patient counseled regarding regular dental exams -q6 months, eye exams- no issues with vision- no regular checks-discussed considering update particularly prediabetes,  avoiding smoking and second hand smoke- very rare cigar once a week-recommend he stop this, limiting alcohol to 2 beverages per day- 2 per week.    2. Risk factor reduction:  Advised patient of need for regular exercise and diet rich and fruits and vegetables to reduce risk of heart attack and stroke. Exercise- slowly going back to the gym-2x a week. Diet recently engaged in intermittent fasting and has been trying to cut fatty foods.  Weight largely stable Wt Readings from Last 3 Encounters:  05/25/21 222 lb 6.4 oz (100.9 kg)  10/04/20 220 lb 3.2 oz (99.9 kg)  01/08/20 224 lb 3.2 oz (101.7 kg)  3. Immunizations/screenings/ancillary studies-discussed COVID-19 #3- wants to postpone until Fall 2022. Immunization History  Administered Date(s) Administered  . Influenza,inj,Quad PF,6+ Mos 09/03/2019, 10/04/2020  . PFIZER(Purple Top)SARS-COV-2 Vaccination 04/01/2020, 04/27/2020  . Td 12/25/2007  . Tdap 10/24/2018   4. Prostate cancer screening- began receiving PSA checked s in 2020-we will trend with PSA today. Rectal exam low risk. Lab Results  Component Value Date   PSA 2.60 09/03/2019   5. Colon cancer screening - we have placed referrals but GI has not been able to reach him- had apparently an in office procedure in 2013- possible sigmoidoscopy 6. Skin cancer screening- lower risk due to melanin content. advised regular sunscreen use. Denies worrisome, changing, or new skin lesions of the area on buttocks  7. Testicular cancer-advised monthly self exams.  He denies any irregularities 8. STD screening - opts in- monogamous but wants to be safe. 9. Occasional cigar smoker- encouraged no smoking ideally-checking urine today  Status of chronic or acute concerns   #Social Updates:  Recently just celebrated his birthday on 05/19/2019. He is going to [place] the week of 05/28/21 and [place] in August.  He is going to try to do some more travel to make up for lost time with COVID.  Apparently traveled to [place] previously and got some impressive upgrades for reasonable prices-[place]!  #Right side Pain- occurs in the morning for just a few minutes.   Better if he stays well-hydrated.  Mattress is rather old- Could potentially be secondary to his mattress.  He has new or worsening symptoms to let us know  #Boil/Hair bump/Abcess- located in an area hard to reach on his posterior side.  Left lower buttocks on exam- patient states improving-opted to continue to monitor unless worsens  # hyperlipidemia/prediabetes S: Medication:Metformin 500 mg daily CBGs- has only had a reading over 6.5 Exercise and diet-see above A/P: Hopefully A1c is stable-update labs today   #hypertension S: medication: Amlodipine 10 mg, chlorthalidone 25 mg BP Readings from Last 3 Encounters:  05/25/21 134/86  10/04/20 132/70  01/08/20 138/88  A/P:  Stable. Continue current medications.     #hyperlipidemia S: Medication: None Lab Results  Component Value Date   CHOL 210 (H) 10/04/2020   HDL 41 10/04/2020   LDLCALC 135 (H) 10/04/2020   TRIG 195 (H) 10/04/2020   CHOLHDL 5.1 (H) 10/04/2020    A/P: Patient with some poor control--discussed coronary  calcium scoring- he is interested but wants to postpone.  Wants to work on diet and exercise and potentially consider if risk gets perhaps above 12% or higher   #Erectile dysfunction- as needed Viagra is helpful- requests a refill and reports no chest pain or SOB- he breaks it up into little pieces.  #Genital Herpes- taking Valacyclovir 500 mg prn. No outbreaks recently. Has med refill if needed.  After today  #Eczema-Eucerin as needed lately- has been doing well.  Refill provided  #Low back pain-etodolac he used it about an average 3 weeks ago-takes sparingly for back pain- when needed.  Recommended follow YW:VPXTGG in about 6 months (around 11/24/2021) for follow up- or sooner if needed.  Lab/Order associations: fasting   ICD-10-CM   1. Preventative health care  Z00.00 CBC with Differential/Platelet    Comprehensive metabolic panel    Lipid panel    Hemoglobin A1c    PSA    POCT Urinalysis Dipstick  (Automated)  2. Herpes  B00.9 valACYclovir (VALTREX) 500 MG tablet  3. Hyperlipidemia, unspecified hyperlipidemia type  E78.5 CBC with Differential/Platelet    Comprehensive metabolic panel    Lipid panel    POCT Urinalysis Dipstick (Automated)  4. Hyperglycemia  R73.9 Hemoglobin A1c  5. Screening for prostate cancer  Z12.5 PSA  6. Primary hypertension  I10 Comprehensive metabolic panel    Lipid panel  7. Screening for HIV (human immunodeficiency virus)  Z11.4 HIV Antibody (routine testing w rflx)  8. Screening examination for venereal disease  Z11.3 RPR  9. Screening for gonorrhea  Z11.3 Urine cytology ancillary only  10. Screening for chlamydial disease  Z11.8 Urine cytology ancillary only  11. Other atopic dermatitis  L20.89 triamcinolone cream (KENALOG) 0.1 %    Meds ordered this encounter  Medications  . valACYclovir (VALTREX) 500 MG tablet    Sig: Take 1 tablet (500 mg total) by mouth 2 (two) times daily. Prn for 3 days with flare up.    Dispense:  30 tablet    Refill:  2  . DISCONTD: sildenafil (VIAGRA) 100 MG tablet    Sig: Take 1 tablet (100 mg total) by mouth daily as needed for erectile dysfunction.    Dispense:  10 tablet    Refill:  11  . sildenafil (VIAGRA) 100 MG tablet    Sig: Take 1 tablet (100 mg total) by mouth daily as needed for erectile dysfunction.    Dispense:  10 tablet    Refill:  11  . amLODipine (NORVASC) 10 MG tablet    Sig: Take 1 tablet (10 mg total) by mouth daily.    Dispense:  90 tablet    Refill:  3  . chlorthalidone (HYGROTON) 25 MG tablet    Sig: Take 1 tablet (25 mg total) by mouth daily.    Dispense:  90 tablet    Refill:  3  . fluticasone (FLONASE) 50 MCG/ACT nasal spray    Sig: Place 2 sprays into both nostrils daily.    Dispense:  48 g    Refill:  3  . metFORMIN (GLUCOPHAGE) 500 MG tablet    Sig: TAKE 1 TABLET BY MOUTH EVERY DAY WITH BREAKFAST    Dispense:  90 tablet    Refill:  3  . triamcinolone cream (KENALOG) 0.1 %    Sig:  Apply 1 application topically 2 (two) times daily. 10 days maximum for eczema flares    Dispense:  80 g    Refill:  2  . etodolac (LODINE)  300 MG capsule    Sig: Take 1 capsule (300 mg total) by mouth every 8 (eight) hours as needed (for sparing back pain).    Dispense:  30 capsule    Refill:  1   I,Harris Phan,acting as a scribe for Tana Conch, MD.,have documented all relevant documentation on the behalf of Tana Conch, MD,as directed by  Tana Conch, MD while in the presence of Tana Conch, MD.   I, Tana Conch, MD, have reviewed all documentation for this visit. The documentation on 05/25/21 for the exam, diagnosis, procedures, and orders are all accurate and complete.   Return precautions advised.  Tana Conch, MD

## 2021-05-25 NOTE — Addendum Note (Signed)
Addended by: Lurlean Horns on: 05/25/2021 10:16 AM   Modules accepted: Orders

## 2021-05-25 NOTE — Patient Instructions (Addendum)
Health Maintenance Due  Topic Date Due  . COVID-19 Vaccine - wants to hold off until the fall for #3  09/27/2020   Please stop by lab before you go If you have mychart- we will send your results within 3 business days of [place] receiving them.  If you do not have mychart- we will call you about results within 5 business days of [place] receiving them.  *please also note that you will see labs on mychart as soon as they post. I will later go in and write notes on them- will say "notes from Dr. Durene Cal"  Since you missed GI call will give you # to call back to schedule colonoscopy [facility] contact Address: [address] [place], [place], [place] [address] Phone: [phone]  Recommended follow up: Return in about 6 months (around 11/24/2021) for follow up- or sooner if needed.

## 2021-05-26 LAB — COMPREHENSIVE METABOLIC PANEL
ALT: 55 U/L — ABNORMAL HIGH (ref 0–53)
AST: 34 U/L (ref 0–37)
Albumin: 4.6 g/dL (ref 3.5–5.2)
Alkaline Phosphatase: 78 U/L (ref 39–117)
BUN: 9 mg/dL (ref 6–23)
CO2: 26 mEq/L (ref 19–32)
Calcium: 9.8 mg/dL (ref 8.4–10.5)
Chloride: 102 mEq/L (ref 96–112)
Creatinine, Ser: 0.87 mg/dL (ref 0.40–1.50)
GFR: 102.39 mL/min (ref >60.00)
Glucose, Bld: 125 mg/dL — ABNORMAL HIGH (ref 70–99)
Potassium: 4.4 mEq/L (ref 3.5–5.1)
Sodium: 140 mEq/L (ref 135–145)
Total Bilirubin: 1.4 mg/dL — ABNORMAL HIGH (ref 0.2–1.2)
Total Protein: 7.4 g/dL (ref 6.0–8.3)

## 2021-05-26 LAB — URINE CYTOLOGY ANCILLARY ONLY
Chlamydia: NEGATIVE
Comment: NEGATIVE
Comment: NEGATIVE
Comment: NORMAL
Neisseria Gonorrhea: NEGATIVE
Trichomonas: NEGATIVE

## 2021-05-26 LAB — LIPID PANEL
Cholesterol: 247 mg/dL — ABNORMAL HIGH (ref 0–200)
HDL: 42.6 mg/dL (ref >39.00)
LDL Cholesterol: 167 mg/dL — ABNORMAL HIGH (ref 0–99)
NonHDL: 203.99
Total CHOL/HDL Ratio: 6
Triglycerides: 185 mg/dL — ABNORMAL HIGH (ref 0.0–149.0)
VLDL: 37 mg/dL (ref 0.0–40.0)

## 2021-05-26 LAB — RPR: RPR Ser Ql: NONREACTIVE

## 2021-05-26 LAB — HIV ANTIBODY (ROUTINE TESTING W REFLEX): HIV 1&2 Ab, 4th Generation: NONREACTIVE

## 2021-05-27 ENCOUNTER — Encounter: Payer: Self-pay | Admitting: Family Medicine

## 2021-05-29 ENCOUNTER — Other Ambulatory Visit: Payer: Self-pay

## 2021-05-29 MED ORDER — ROSUVASTATIN CALCIUM 10 MG PO TABS: 10.0000 mg | ORAL_TABLET | ORAL | 3 refills | Status: DC

## 2021-05-31 ENCOUNTER — Encounter: Payer: Self-pay | Admitting: Family Medicine

## 2021-06-05 ENCOUNTER — Other Ambulatory Visit: Payer: Self-pay | Admitting: Family Medicine

## 2021-06-05 DIAGNOSIS — B009 Herpesviral infection, unspecified: Secondary | ICD-10-CM

## 2021-06-19 ENCOUNTER — Other Ambulatory Visit: Payer: Self-pay | Admitting: Family Medicine

## 2021-06-19 DIAGNOSIS — B009 Herpesviral infection, unspecified: Secondary | ICD-10-CM

## 2021-10-10 ENCOUNTER — Encounter: Payer: Self-pay | Admitting: Family Medicine

## 2021-11-15 ENCOUNTER — Other Ambulatory Visit: Payer: Self-pay | Admitting: Family Medicine

## 2021-11-28 ENCOUNTER — Ambulatory Visit: Payer: 59 | Admitting: Family Medicine

## 2022-01-04 ENCOUNTER — Ambulatory Visit: Payer: 59 | Admitting: Family Medicine

## 2022-01-04 ENCOUNTER — Other Ambulatory Visit: Payer: Self-pay

## 2022-01-04 ENCOUNTER — Encounter: Payer: Self-pay | Admitting: Family Medicine

## 2022-01-04 VITALS — BP 120/86 | HR 84 | Temp 98.1°F | Ht 69.0 in | Wt 224.8 lb

## 2022-01-04 DIAGNOSIS — Z79899 Other long term (current) drug therapy: Secondary | ICD-10-CM | POA: Diagnosis not present

## 2022-01-04 DIAGNOSIS — M546 Pain in thoracic spine: Secondary | ICD-10-CM

## 2022-01-04 DIAGNOSIS — E785 Hyperlipidemia, unspecified: Secondary | ICD-10-CM | POA: Diagnosis not present

## 2022-01-04 DIAGNOSIS — B009 Herpesviral infection, unspecified: Secondary | ICD-10-CM

## 2022-01-04 DIAGNOSIS — I10 Essential (primary) hypertension: Secondary | ICD-10-CM

## 2022-01-04 DIAGNOSIS — L2089 Other atopic dermatitis: Secondary | ICD-10-CM

## 2022-01-04 DIAGNOSIS — E119 Type 2 diabetes mellitus without complications: Secondary | ICD-10-CM | POA: Diagnosis not present

## 2022-01-04 DIAGNOSIS — G8929 Other chronic pain: Secondary | ICD-10-CM

## 2022-01-04 DIAGNOSIS — R972 Elevated prostate specific antigen [PSA]: Secondary | ICD-10-CM

## 2022-01-04 MED ORDER — ETODOLAC 300 MG PO CAPS: 300.0000 mg | ORAL_CAPSULE | Freq: Three times a day (TID) | ORAL | 1 refills | Status: DC | PRN

## 2022-01-04 MED ORDER — ROSUVASTATIN CALCIUM 10 MG PO TABS: 10.0000 mg | ORAL_TABLET | ORAL | 3 refills | Status: DC

## 2022-01-04 MED ORDER — SILDENAFIL CITRATE 100 MG PO TABS: 100.0000 mg | ORAL_TABLET | Freq: Every day | ORAL | 11 refills | Status: DC | PRN

## 2022-01-04 MED ORDER — VALACYCLOVIR HCL 500 MG PO TABS: ORAL_TABLET | ORAL | 1 refills | Status: DC

## 2022-01-04 MED ORDER — FLUTICASONE PROPIONATE 50 MCG/ACT NA SUSP: 2.0000 | Freq: Every day | NASAL | 3 refills | Status: DC

## 2022-01-04 MED ORDER — CHLORTHALIDONE 25 MG PO TABS: 25.0000 mg | ORAL_TABLET | Freq: Every day | ORAL | 3 refills | Status: DC

## 2022-01-04 MED ORDER — METFORMIN HCL 500 MG PO TABS: ORAL_TABLET | ORAL | 3 refills | Status: DC

## 2022-01-04 MED ORDER — AMLODIPINE BESYLATE 10 MG PO TABS: 10.0000 mg | ORAL_TABLET | Freq: Every day | ORAL | 3 refills | Status: DC

## 2022-01-04 MED ORDER — TRIAMCINOLONE ACETONIDE 0.1 % EX CREA: 1.0000 "application " | TOPICAL_CREAM | Freq: Two times a day (BID) | CUTANEOUS | 2 refills | Status: DC

## 2022-01-04 NOTE — Patient Instructions (Addendum)
Health Maintenance Due  Topic Date Due   OPHTHALMOLOGY EXAM  - Please call and schedule with a ophthalmologist to have a diabetic eye exam completed. Make sure to inform then that you do have diabetes.  Never done   COVID-19 Vaccine (3 - Booster for [employer] series) -Consider getting the Omicron/Bivalent booster at your local pharmacy - please let us know when you have received this vaccination either by calling us or sending a message through MyChart.  06/22/2020   INFLUENZA VACCINE  - Patient wants to hold off this season.  07/24/2021   Please stop by lab before you go If you have mychart- we will send your results within 3 business days of [place] receiving them.  If you do not have mychart- we will call you about results within 5 business days of [place] receiving them.  *please also note that you will see labs on mychart as soon as they post. I will later go in and write notes on them- will say "notes from Dr. Durene Cal"  All your prescriptions will be refilled and sent to your local [facility] today.  We will call you within two weeks about your referral to [facility]. If you do not hear within 2 weeks, give [place] a call.   Recommended follow up: Return in about 6 months (around 07/04/2022) for a physical.

## 2022-01-04 NOTE — Progress Notes (Signed)
Phone [phone] In person visit   Subjective:   [name] [name] is a 51 y.o. year old very pleasant male patient who presents for/with See problem oriented charting Chief Complaint  Patient presents with   Follow-up   Hypertension   Diabetes   Hyperlipidemia    This visit occurred during the SARS-CoV-2 public health emergency.  Safety protocols were in place, including screening questions prior to the visit, additional usage of staff PPE, and extensive cleaning of exam room while observing appropriate contact time as indicated for disinfecting solutions.   Past Medical History-  Patient Active Problem List   Diagnosis Date Noted   Low back pain without sciatica 10/24/2018    Priority: Medium    Atopic dermatitis 10/15/2017    Priority: Medium    Type 2 diabetes mellitus without complications ([place]) 94/70/9628    Priority: Medium    Hyperlipidemia 09/16/2015    Priority: Medium    Hypertension 09/09/2014    Priority: Medium    Benign paroxysmal positional vertigo 11/03/2016    Priority: Low   Genital herpes 09/09/2014    Priority: Low   Erectile dysfunction 01/08/2020    Medications- reviewed and updated Current Outpatient Medications  Medication Sig Dispense Refill   amLODipine (NORVASC) 10 MG tablet Take 1 tablet (10 mg total) by mouth daily. 90 tablet 3   chlorthalidone (HYGROTON) 25 MG tablet Take 1 tablet (25 mg total) by mouth daily. 90 tablet 3   etodolac (LODINE) 300 MG capsule Take 1 capsule (300 mg total) by mouth every 8 (eight) hours as needed (for sparing back pain). 30 capsule 1   fluticasone (FLONASE) 50 MCG/ACT nasal spray Place 2 sprays into both nostrils daily. 48 g 3   metFORMIN (GLUCOPHAGE) 500 MG tablet TAKE 1 TABLET BY MOUTH EVERY DAY WITH BREAKFAST 90 tablet 3   rosuvastatin (CRESTOR) 10 MG tablet Take 1 tablet (10 mg total) by mouth once a week. Take 1 tab weekly. 13 tablet 3   sildenafil (VIAGRA) 100 MG tablet Take 1 tablet (100 mg total) by  mouth daily as needed for erectile dysfunction. 10 tablet 11   triamcinolone cream (KENALOG) 0.1 % Apply 1 application topically 2 (two) times daily. 10 days maximum for eczema flares 80 g 2   valACYclovir (VALTREX) 500 MG tablet TAKE 1 TABLET (500 MG TOTAL) BY MOUTH 2 (TWO) TIMES DAILY OR AS NEEDED FOR 3 DAYS DURING FLARE 180 tablet 1   No current facility-administered medications for this visit.     Objective:  BP 120/86    Pulse 84    Temp 98.1 F (36.7 C)    Ht 5' 9"  (1.753 m)    Wt 224 lb 12.8 oz (102 kg)    SpO2 98%    BMI 33.20 kg/m  Gen: NAD, resting comfortably CV: RRR no murmurs rubs or gallops Lungs: CTAB no crackles, wheeze, rhonchi No cva tenderness Ext: no edema Skin: warm, dry Msk- see comments below Diabetic Foot Exam - Simple   Simple Foot Form Diabetic Foot exam was performed with the following findings: Yes 01/04/2022  3:58 PM  Visual Inspection No deformities, no ulcerations, no other skin breakdown bilaterally: Yes Sensation Testing Intact to touch and monofilament testing bilaterally: Yes Pulse Check Posterior Tibialis and Dorsalis pulse intact bilaterally: Yes Comments       Assessment and Plan   #Slight increase in PSA last visit more than I am comfortable with-update PSA today-I have asked team to have this set up 2  months from June visit but do not see that this was arranged-we will correct this today  # right thoracic back pain S:every morning he wakes up with discomfort 4/10. Resolves within 1-2 hours but recurs everyday.  Better if had good workout that week running or elliptical. On and off for years but recent spell has eben 3 months- has not been this long in the past.  -has taking etodolac shortens interval of pain but does not do this regularly A/P: Patient with long-term intermittent right thoracic back pain with more persistent pain over the last 3 months every morning when he wakes up for 1 to 2 hours-I think it be reasonable to see [place]  sports medicine for their expert opinion-I wonder if he would benefit from osteopathic manipulation in particular.  Mildly tender at the bottom portion of ribs and right lateral thoracic back  # Diabetes S: Medication:Metformin 500 mg daily CBGs- doesn't check Exercise and diet- up 2 lbs through holidays. Exercising though 2-3 days a week other than if ill. Trying to improve food choices- more salad/fruit- making progress but feels has more could make Lab Results  Component Value Date   HGBA1C 6.8 (H) 05/25/2021   HGBA1C 5.9 (H) 10/04/2020   HGBA1C 6.3 (H) 01/08/2020  A/P:  hopefully stable- update A1c today. Continue current meds for now   #hypertension S: medication: Amlodipine 10 mg, chlorthalidone 25 mg BP Readings from Last 3 Encounters:  01/04/22 120/86  05/25/21 134/86  10/04/20 132/70  A/P: Well-controlled on repeat-continue current medication   #hyperlipidemia S: Medication:rosuvastatin 10 mg once weekly Lab Results  Component Value Date   CHOL 247 (H) 05/25/2021   HDL 42.60 05/25/2021   LDLCALC 167 (H) 05/25/2021   TRIG 185.0 (H) 05/25/2021   CHOLHDL 6 05/25/2021  A/P: Hopefully some mild improvement-update LDL along with labs today -may try 20 mg once a week if above 100  #Erectile dysfunction- as needed Viagra is helpful- [facility]   #Genital Herpes- taking Valacyclovir 500 mg prn. No outbreaks recently. Has med refill if needed.- [facility]ill [facility] this time   #Eczema-Eucerin as needed lately- has been doing well but needs refill   Recommended follow up: Return in about 6 months (around 07/04/2022) for a physical.  Health Maintenance Due  Topic Date Due   Pneumococcal Vaccine 74-27 Years old (1 - PCV)- declines for now Never done   OPHTHALMOLOGY EXAM - advised to schedule Never done           Lab/Order associations:   ICD-10-CM   1. Type 2 diabetes mellitus without complication, unspecified whether long term insulin use (HCC)  E11.9 Microalbumin /  creatinine urine ratio    Hemoglobin A1c    LDL cholesterol, direct    Comp Met (CMET)    CBC with Differential/Platelet    2. Primary hypertension  I10 Comp Met (CMET)    CBC with Differential/Platelet    3. Hyperlipidemia, unspecified hyperlipidemia type  E78.5 LDL cholesterol, direct    Comp Met (CMET)    4. Elevated PSA  R97.20 PSA    5. High risk medication use  Z79.899 Vitamin B12    6. Other atopic dermatitis  L20.89 triamcinolone cream (KENALOG) 0.1 %    7. Herpes  B00.9 valACYclovir (VALTREX) 500 MG tablet      Meds ordered this encounter  Medications   triamcinolone cream (KENALOG) 0.1 %    Sig: Apply 1 application topically 2 (two) times daily. 10 days maximum for  eczema flares    Dispense:  80 g    Refill:  2   etodolac (LODINE) 300 MG capsule    Sig: Take 1 capsule (300 mg total) by mouth every 8 (eight) hours as needed (for sparing back pain).    Dispense:  30 capsule    Refill:  1   amLODipine (NORVASC) 10 MG tablet    Sig: Take 1 tablet (10 mg total) by mouth daily.    Dispense:  90 tablet    Refill:  3   rosuvastatin (CRESTOR) 10 MG tablet    Sig: Take 1 tablet (10 mg total) by mouth once a week. Take 1 tab weekly.    Dispense:  13 tablet    Refill:  3   chlorthalidone (HYGROTON) 25 MG tablet    Sig: Take 1 tablet (25 mg total) by mouth daily.    Dispense:  90 tablet    Refill:  3   metFORMIN (GLUCOPHAGE) 500 MG tablet    Sig: TAKE 1 TABLET BY MOUTH EVERY DAY WITH BREAKFAST    Dispense:  90 tablet    Refill:  3   fluticasone (FLONASE) 50 MCG/ACT nasal spray    Sig: Place 2 sprays into both nostrils daily.    Dispense:  48 g    Refill:  3   sildenafil (VIAGRA) 100 MG tablet    Sig: Take 1 tablet (100 mg total) by mouth daily as needed for erectile dysfunction.    Dispense:  10 tablet    Refill:  11   valACYclovir (VALTREX) 500 MG tablet    Sig: TAKE 1 TABLET (500 MG TOTAL) BY MOUTH 2 (TWO) TIMES DAILY OR AS NEEDED FOR 3 DAYS DURING FLARE     Dispense:  180 tablet    Refill:  1   I,Harris Phan,acting as a scribe for Garret Reddish, MD.,have documented all relevant documentation on the behalf of Garret Reddish, MD,as directed by  Garret Reddish, MD while in the presence of Garret Reddish, MD.    I, Garret Reddish, MD, have reviewed all documentation for this visit. The documentation on 01/04/22 for the exam, diagnosis, procedures, and orders are all accurate and complete.   Return precautions advised.  Garret Reddish, MD

## 2022-01-05 LAB — COMPREHENSIVE METABOLIC PANEL
ALT: 40 U/L (ref 0–53)
AST: 24 U/L (ref 0–37)
Albumin: 4.8 g/dL (ref 3.5–5.2)
Alkaline Phosphatase: 89 U/L (ref 39–117)
BUN: 9 mg/dL (ref 6–23)
CO2: 30 mEq/L (ref 19–32)
Calcium: 10.3 mg/dL (ref 8.4–10.5)
Chloride: 101 mEq/L (ref 96–112)
Creatinine, Ser: 0.84 mg/dL (ref 0.40–1.50)
GFR: 103.03 mL/min (ref >60.00)
Glucose, Bld: 96 mg/dL (ref 70–99)
Potassium: 4.2 mEq/L (ref 3.5–5.1)
Sodium: 140 mEq/L (ref 135–145)
Total Bilirubin: 1.1 mg/dL (ref 0.2–1.2)
Total Protein: 7.7 g/dL (ref 6.0–8.3)

## 2022-01-05 LAB — CBC WITH DIFFERENTIAL/PLATELET
Basophils Absolute: 0.1 10*3/uL (ref 0.0–0.1)
Basophils Relative: 0.7 % (ref 0.0–3.0)
Eosinophils Absolute: 0.2 10*3/uL (ref 0.0–0.7)
Eosinophils Relative: 2.3 % (ref 0.0–5.0)
HCT: 46 % (ref 39.0–52.0)
Hemoglobin: 15.8 g/dL (ref 13.0–17.0)
Lymphocytes Relative: 44.9 % (ref 12.0–46.0)
Lymphs Abs: 3.6 10*3/uL (ref 0.7–4.0)
MCHC: 34.4 g/dL (ref 30.0–36.0)
MCV: 81.1 fl (ref 78.0–100.0)
Monocytes Absolute: 0.5 10*3/uL (ref 0.1–1.0)
Monocytes Relative: 6.5 % (ref 3.0–12.0)
Neutro Abs: 3.7 10*3/uL (ref 1.4–7.7)
Neutrophils Relative %: 45.6 % (ref 43.0–77.0)
Platelets: 393 10*3/uL (ref 150.0–400.0)
RBC: 5.67 Mil/uL (ref 4.22–5.81)
RDW: 13.4 % (ref 11.5–15.5)
WBC: 8.1 10*3/uL (ref 4.0–10.5)

## 2022-01-05 LAB — VITAMIN B12: Vitamin B-12: 264 pg/mL (ref 211–911)

## 2022-01-05 LAB — MICROALBUMIN / CREATININE URINE RATIO
Creatinine,U: 161.5 mg/dL
Microalb Creat Ratio: 1.6 mg/g (ref 0.0–30.0)
Microalb, Ur: 2.6 mg/dL — ABNORMAL HIGH (ref 0.0–1.9)

## 2022-01-05 LAB — HEMOGLOBIN A1C: Hgb A1c MFr Bld: 7.1 % — ABNORMAL HIGH (ref 4.6–6.5)

## 2022-01-05 LAB — PSA: PSA: 3.17 ng/mL (ref 0.10–4.00)

## 2022-01-05 LAB — LDL CHOLESTEROL, DIRECT: Direct LDL: 159 mg/dL

## 2022-01-11 ENCOUNTER — Encounter: Payer: Self-pay | Admitting: Family Medicine

## 2022-02-14 LAB — HM DIABETES EYE EXAM

## 2022-06-06 ENCOUNTER — Other Ambulatory Visit: Payer: Self-pay | Admitting: Family Medicine

## 2022-07-03 ENCOUNTER — Encounter: Payer: Self-pay | Admitting: Family Medicine

## 2022-07-05 ENCOUNTER — Ambulatory Visit (INDEPENDENT_AMBULATORY_CARE_PROVIDER_SITE_OTHER): Payer: 59 | Admitting: Family Medicine

## 2022-07-05 ENCOUNTER — Encounter: Payer: Self-pay | Admitting: Family Medicine

## 2022-07-05 VITALS — BP 116/70 | HR 72 | Temp 98.1°F | Ht 69.0 in | Wt 220.0 lb

## 2022-07-05 DIAGNOSIS — E785 Hyperlipidemia, unspecified: Secondary | ICD-10-CM

## 2022-07-05 DIAGNOSIS — Z0000 Encounter for general adult medical examination without abnormal findings: Secondary | ICD-10-CM | POA: Diagnosis not present

## 2022-07-05 DIAGNOSIS — M545 Low back pain, unspecified: Secondary | ICD-10-CM

## 2022-07-05 DIAGNOSIS — E1169 Type 2 diabetes mellitus with other specified complication: Secondary | ICD-10-CM

## 2022-07-05 DIAGNOSIS — I10 Essential (primary) hypertension: Secondary | ICD-10-CM | POA: Diagnosis not present

## 2022-07-05 DIAGNOSIS — N529 Male erectile dysfunction, unspecified: Secondary | ICD-10-CM

## 2022-07-05 DIAGNOSIS — R6882 Decreased libido: Secondary | ICD-10-CM

## 2022-07-05 DIAGNOSIS — E119 Type 2 diabetes mellitus without complications: Secondary | ICD-10-CM | POA: Diagnosis not present

## 2022-07-05 DIAGNOSIS — G8929 Other chronic pain: Secondary | ICD-10-CM

## 2022-07-05 DIAGNOSIS — Z125 Encounter for screening for malignant neoplasm of prostate: Secondary | ICD-10-CM

## 2022-07-05 NOTE — Patient Instructions (Addendum)
come back fasting for testosterone check  between 8-9 am- schedule before you leave  Recommended follow up: No follow-ups on file.

## 2022-07-05 NOTE — Progress Notes (Signed)
Phone: [phone]    Subjective:  Patient presents today for their annual physical. Chief complaint-noted.   See problem oriented charting- ROS- full  review of systems was completed and negative  except for: low libido, intermittent back pain, erectile issues- medicine still working though  The following were reviewed and entered/updated in epic: Past Medical History:  Diagnosis Date   Herpes    Hypertension    Patient Active Problem List   Diagnosis Date Noted   Low back pain without sciatica 10/24/2018    Priority: Medium    Atopic dermatitis 10/15/2017    Priority: Medium    Type 2 diabetes mellitus without complications (HCC) 09/16/2015    Priority: Medium    Hyperlipidemia associated with type 2 diabetes mellitus (HCC) 09/16/2015    Priority: Medium    Hypertension 09/09/2014    Priority: Medium    Erectile dysfunction 01/08/2020    Priority: Low   Benign paroxysmal positional vertigo 11/03/2016    Priority: Low   Genital herpes 09/09/2014    Priority: Low   Past Surgical History:  Procedure Laterality Date   none      Family History  Problem Relation Age of Onset   Diabetes Mother    Kidney disease Brother        transplant    Medications- reviewed and updated Current Outpatient Medications  Medication Sig Dispense Refill   amLODipine (NORVASC) 10 MG tablet TAKE 1 TABLET BY MOUTH EVERY DAY 90 tablet 3   chlorthalidone (HYGROTON) 25 MG tablet Take 1 tablet (25 mg total) by mouth daily. 90 tablet 3   etodolac (LODINE) 300 MG capsule Take 1 capsule (300 mg total) by mouth every 8 (eight) hours as needed (for sparing back pain). 30 capsule 1   fluticasone (FLONASE) 50 MCG/ACT nasal spray Place 2 sprays into both nostrils daily. 48 g 3   metFORMIN (GLUCOPHAGE) 500 MG tablet TAKE 1 TABLET BY MOUTH EVERY DAY WITH BREAKFAST 90 tablet 3   rosuvastatin (CRESTOR) 10 MG tablet Take 1 tablet (10 mg total) by mouth once a week. Take 1 tab weekly. 13 tablet 3    sildenafil (VIAGRA) 100 MG tablet Take 1 tablet (100 mg total) by mouth daily as needed for erectile dysfunction. 10 tablet 11   triamcinolone cream (KENALOG) 0.1 % Apply 1 application topically 2 (two) times daily. 10 days maximum for eczema flares 80 g 2   valACYclovir (VALTREX) 500 MG tablet TAKE 1 TABLET (500 MG TOTAL) BY MOUTH 2 (TWO) TIMES DAILY OR AS NEEDED FOR 3 DAYS DURING FLARE 180 tablet 1   vitamin B-12 (CYANOCOBALAMIN) 1000 MCG tablet Take 1,000 mcg by mouth 2 (two) times a week.     No current facility-administered medications for this visit.    Allergies-reviewed and updated Allergies  Allergen Reactions   Lisinopril     Vertigo thought potentially caused by lisinopril- resolved off med    Social History   Social History Narrative   Married 2005 to [employer]. 1 son who is at [employer], [employer].       Now works with [employer].    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    BA from [employer].       Hobbies: gamer-call of duty, [employer], nba 2      Objective:  BP 116/70   Pulse 72   Temp 98.1 F (36.7  C)   Ht 5\' 9"  (1.753 m)   Wt 220 lb (99.8 kg)   SpO2 98%   BMI 32.49 kg/m  Gen: NAD, resting comfortably HEENT: Mucous membranes are moist. Oropharynx normal Neck: no thyromegaly CV: RRR no murmurs rubs or gallops Lungs: CTAB no crackles, wheeze, rhonchi Abdomen: soft/nontender/nondistended/normal bowel sounds. No rebound or guarding.  Ext: no edema Skin: warm, dry Neuro: grossly normal, moves all extremities, PERRLA    Assessment and Plan:  51 y.o. male presenting for annual physical.  Health Maintenance counseling: 1. Anticipatory guidance: Patient counseled regarding regular dental exams -q6 months- needs to schedule - about 2 years, eye exams - had this year- he has spoken to them and they will send [place] a copy,  avoiding smoking and  second hand smoke- sparing cigar once a week- encouraged cessation , limiting alcohol to 2 beverages per day, no illicit drugs.   2. Risk factor reduction:  Advised patient of need for regular exercise and diet rich and fruits and vegetables to reduce risk of heart attack and stroke.  Exercise- struggling right now with schedule- goal 150 minutes per wek.  Diet/weight management-intermittent fasting helping- has lost 4 lbs.  Wt Readings from Last 3 Encounters:  07/05/22 220 lb (99.8 kg)  01/04/22 224 lb 12.8 oz (102 kg)  05/25/21 222 lb 6.4 oz (100.9 kg)  3. Immunizations/screenings/ancillary studies- wants to hold off on covid shot, recommended fall flu shot Immunization History  Administered Date(s) Administered   Influenza,inj,Quad PF,6+ Mos 09/03/2019, 10/04/2020   PFIZER(Purple Top)SARS-COV-2 Vaccination 04/01/2020, 04/27/2020   Td 12/25/2007   Tdap 10/24/2018  4. Prostate cancer screening-  some variability in psa- trend again today- if increases more than 0.35 or so consider urology consult  Lab Results  Component Value Date   PSA 3.17 01/04/2022   PSA 3.31 05/25/2021   PSA 2.60 09/03/2019   5. Colon cancer screening -  Colonoscopy 2013 normal reportedly for rectal bleeding. He is planning on repeat at age 41 6. Skin cancer screening/prevention- lower risk due to melanin content. advised regular sunscreen use. Denies worrisome, changing, or new skin lesions.  7. Testicular cancer screening- advised monthly self exams  8. STD screening- patient opts out- only active with wife 85. Smoking associated screening- some cigar smoking- recommended against/cessation- check UA with labs  Status of chronic or acute concerns   # Diabetes S: Medication:Metformin 500 mg daily.  CBGs- doesn't check Exercise and diet- trying intermittent fasting and feels helpful- feels boost of energy. Down 4 lbs. Hasnt made gym quite as much as he would like Lab Results  Component Value Date   HGBA1C 7.1  (H) 01/04/2022   HGBA1C 6.8 (H) 05/25/2021   HGBA1C 5.9 (H) 10/04/2020  A/P: hopefully improved- update a1c today. Continue current meds for now. Congratulated on 4 lbs weight loss -Low normal B12-recommended B12 supplement at least weekly- he is taking twice a week   #hypertension S: medication: Amlodipine 10 mg, chlorthalidone 25 mg Home readings #s:  intermittently checks - sometimes higher but looks good here BP Readings from Last 3 Encounters:  07/05/22 116/70  01/04/22 120/86  05/25/21 134/86  A/P: Controlled. Continue current medications.    #hyperlipidemia S: Medication:Rosuvastatin 10 mg once a week Lab Results  Component Value Date   CHOL 247 (H) 05/25/2021   HDL 42.60 05/25/2021   LDLCALC 167 (H) 05/25/2021   LDLDIRECT 159.0 01/04/2022   TRIG 185.0 (H) 05/25/2021   CHOLHDL 6 05/25/2021  A/P: hopefully improved-  update lipid panel today. Continue current meds for now  #Low back pain-sparing etodolac 300 mg once a month.  Was worsening at last visit and recommended referral to sports medicine-voicemails were left and he was sent a letter- he got busy- activity actually helps him- doing fine now  #Erectile dysfunction- as needed Viagra is helpful  - has noted more fatigue but fasting helped. Does note low libido as well so will come back fasting for testosterone check  between 8-9 am  #Genital Herpes- taking Valacyclovir 500 mg prn. No outbreaks recently. Has med refill if needed. I'm willing to refill if needed   #Eczema-Eucerin as needed lately- has been doing well    Recommended follow up: Return in about 6 months (around 01/05/2023) for followup or sooner if needed.Schedule b4 you leave.  Lab/Order associations: fasting   ICD-10-CM   1. Preventative health care  Z00.00     2. Type 2 diabetes mellitus without complication, unspecified whether long term insulin use (HCC)  E11.9     3. Primary hypertension  I10     4. Hyperlipidemia associated with type 2  diabetes mellitus (HCC)  E11.69    E78.5     5. Chronic bilateral low back pain without sciatica  M54.50    G89.29     6. Low libido  R68.82     7. Erectile dysfunction, unspecified erectile dysfunction type  N52.9       No orders of the defined types were placed in this encounter.   Return precautions advised.  Tana Conch, MD

## 2022-07-05 NOTE — Addendum Note (Signed)
Addended by: Laddie Aquas A on: 07/05/2022 09:44 AM   Modules accepted: Orders

## 2022-07-13 ENCOUNTER — Telehealth: Payer: Self-pay | Admitting: Family Medicine

## 2022-07-13 NOTE — Telephone Encounter (Signed)
-----   Message from Lorenza Evangelist, [place] sent at 07/13/2022  1:41 PM EDT ----- Please call and schedule lab visit for pt. Dr. Durene Cal does not want pt to wait until 6 month f/u visit for labs. Make sure he gets scheduled between 8-9 am. ----- Message ----- From: Shelva Majestic, MD Sent: 07/12/2022   8:35 PM EDT To: Lorenza Evangelist, CMA  We need labs now - dont want to wait that long for a1c- can you call to have him come in? thanks ----- Message ----- From: Lorenza Evangelist, CMA Sent: 07/12/2022   8:50 AM EDT To: Shelva Majestic, MD  Katie said she had released them (which is why they say collected) but once she saw the time (9:37) and that you wanted them drawn between 8-9 she did not draw them she re ordered them as future and pt said he would have these done at his 6 month f/u.  ----- Message ----- From: Shelva Majestic, MD Sent: 07/11/2022   3:02 PM EDT To: Kathryne Eriksson  Still looking for labs from last week- says collected- can we check on these with labs please? ----- Message ----- From: SYSTEM Sent: 07/10/2022  12:17 AM EDT To: Shelva Majestic, MD

## 2022-07-13 NOTE — Telephone Encounter (Signed)
Left patient vm to call back to schedule lab appt. 

## 2022-07-25 ENCOUNTER — Other Ambulatory Visit (INDEPENDENT_AMBULATORY_CARE_PROVIDER_SITE_OTHER): Payer: 59

## 2022-07-25 DIAGNOSIS — Z0000 Encounter for general adult medical examination without abnormal findings: Secondary | ICD-10-CM

## 2022-07-25 DIAGNOSIS — I10 Essential (primary) hypertension: Secondary | ICD-10-CM

## 2022-07-25 DIAGNOSIS — G8929 Other chronic pain: Secondary | ICD-10-CM | POA: Diagnosis not present

## 2022-07-25 DIAGNOSIS — E119 Type 2 diabetes mellitus without complications: Secondary | ICD-10-CM | POA: Diagnosis not present

## 2022-07-25 DIAGNOSIS — M545 Low back pain, unspecified: Secondary | ICD-10-CM

## 2022-07-25 DIAGNOSIS — E1169 Type 2 diabetes mellitus with other specified complication: Secondary | ICD-10-CM | POA: Diagnosis not present

## 2022-07-25 DIAGNOSIS — E785 Hyperlipidemia, unspecified: Secondary | ICD-10-CM | POA: Diagnosis not present

## 2022-07-25 DIAGNOSIS — Z125 Encounter for screening for malignant neoplasm of prostate: Secondary | ICD-10-CM | POA: Diagnosis not present

## 2022-07-25 DIAGNOSIS — N529 Male erectile dysfunction, unspecified: Secondary | ICD-10-CM

## 2022-07-25 DIAGNOSIS — R6882 Decreased libido: Secondary | ICD-10-CM

## 2022-07-25 LAB — POCT URINALYSIS DIPSTICK
Bilirubin, UA: NEGATIVE
Blood, UA: NEGATIVE
Glucose, UA: NEGATIVE
Ketones, UA: NEGATIVE
Leukocytes, UA: NEGATIVE
Nitrite, UA: NEGATIVE
Protein, UA: NEGATIVE
Spec Grav, UA: 1.01 (ref 1.010–1.025)
Urobilinogen, UA: 0.2 E.U./dL
pH, UA: 6.5 (ref 5.0–8.0)

## 2022-07-25 LAB — CBC WITH DIFFERENTIAL/PLATELET
Basophils Absolute: 0 10*3/uL (ref 0.0–0.1)
Basophils Relative: 0.3 % (ref 0.0–3.0)
Eosinophils Absolute: 0.1 10*3/uL (ref 0.0–0.7)
Eosinophils Relative: 2.3 % (ref 0.0–5.0)
HCT: 46.3 % (ref 39.0–52.0)
Hemoglobin: 16.2 g/dL (ref 13.0–17.0)
Lymphocytes Relative: 41.3 % (ref 12.0–46.0)
Lymphs Abs: 2.1 10*3/uL (ref 0.7–4.0)
MCHC: 35 g/dL (ref 30.0–36.0)
MCV: 81.2 fl (ref 78.0–100.0)
Monocytes Absolute: 0.4 10*3/uL (ref 0.1–1.0)
Monocytes Relative: 7.3 % (ref 3.0–12.0)
Neutro Abs: 2.4 10*3/uL (ref 1.4–7.7)
Neutrophils Relative %: 48.8 % (ref 43.0–77.0)
Platelets: 317 10*3/uL (ref 150.0–400.0)
RBC: 5.7 Mil/uL (ref 4.22–5.81)
RDW: 13.5 % (ref 11.5–15.5)
WBC: 5 10*3/uL (ref 4.0–10.5)

## 2022-07-25 LAB — LIPID PANEL
Cholesterol: 210 mg/dL — ABNORMAL HIGH (ref 0–200)
HDL: 41.1 mg/dL (ref >39.00)
LDL Cholesterol: 132 mg/dL — ABNORMAL HIGH (ref 0–99)
NonHDL: 168.44
Total CHOL/HDL Ratio: 5
Triglycerides: 181 mg/dL — ABNORMAL HIGH (ref 0.0–149.0)
VLDL: 36.2 mg/dL (ref 0.0–40.0)

## 2022-07-25 LAB — COMPREHENSIVE METABOLIC PANEL
ALT: 46 U/L (ref 0–53)
AST: 37 U/L (ref 0–37)
Albumin: 4.8 g/dL (ref 3.5–5.2)
Alkaline Phosphatase: 77 U/L (ref 39–117)
BUN: 10 mg/dL (ref 6–23)
CO2: 28 mEq/L (ref 19–32)
Calcium: 10 mg/dL (ref 8.4–10.5)
Chloride: 98 mEq/L (ref 96–112)
Creatinine, Ser: 0.98 mg/dL (ref 0.40–1.50)
GFR: 90.75 mL/min (ref >60.00)
Glucose, Bld: 130 mg/dL — ABNORMAL HIGH (ref 70–99)
Potassium: 3.8 mEq/L (ref 3.5–5.1)
Sodium: 137 mEq/L (ref 135–145)
Total Bilirubin: 2.5 mg/dL — ABNORMAL HIGH (ref 0.2–1.2)
Total Protein: 7.6 g/dL (ref 6.0–8.3)

## 2022-07-25 LAB — PSA: PSA: 3.59 ng/mL (ref 0.10–4.00)

## 2022-07-25 LAB — HEMOGLOBIN A1C: Hgb A1c MFr Bld: 6.7 % — ABNORMAL HIGH (ref 4.6–6.5)

## 2022-07-26 ENCOUNTER — Other Ambulatory Visit: Payer: Self-pay

## 2022-07-26 DIAGNOSIS — R972 Elevated prostate specific antigen [PSA]: Secondary | ICD-10-CM

## 2022-07-26 LAB — TESTOSTERONE TOTAL,FREE,BIO, MALES
Albumin: 4.7 g/dL (ref 3.6–5.1)
Sex Hormone Binding: 28 nmol/L (ref 10–50)
Testosterone, Bioavailable: 150.6 ng/dL (ref 110.0–575.0)
Testosterone, Free: 70.3 pg/mL (ref 46.0–224.0)
Testosterone: 467 ng/dL (ref 250–827)

## 2022-09-17 ENCOUNTER — Encounter: Payer: Self-pay | Admitting: *Deleted

## 2022-09-26 ENCOUNTER — Ambulatory Visit: Payer: 59 | Admitting: Family Medicine

## 2022-09-26 ENCOUNTER — Encounter: Payer: Self-pay | Admitting: Family Medicine

## 2022-09-26 VITALS — BP 132/84 | HR 80 | Temp 97.9°F | Ht 69.0 in | Wt 217.8 lb

## 2022-09-26 DIAGNOSIS — M7711 Lateral epicondylitis, right elbow: Secondary | ICD-10-CM

## 2022-09-26 DIAGNOSIS — I10 Essential (primary) hypertension: Secondary | ICD-10-CM | POA: Diagnosis not present

## 2022-09-26 NOTE — Progress Notes (Signed)
Phone [phone] In person visit   Subjective:   [name] [name] is a 51 y.o. year old very pleasant male patient who presents for/with See problem oriented charting Chief Complaint  Patient presents with   Arm Pain    Pt c/o right arm pain that started 3 weeks ago after lifting an object.      Past Medical History-  Patient Active Problem List   Diagnosis Date Noted   Low back pain without sciatica 10/24/2018    Priority: Medium    Atopic dermatitis 10/15/2017    Priority: Medium    Type 2 diabetes mellitus without complications (HCC) 09/16/2015    Priority: Medium    Hyperlipidemia associated with type 2 diabetes mellitus (HCC) 09/16/2015    Priority: Medium    Hypertension 09/09/2014    Priority: Medium    Erectile dysfunction 01/08/2020    Priority: Low   Benign paroxysmal positional vertigo 11/03/2016    Priority: Low   Genital herpes 09/09/2014    Priority: Low    Medications- reviewed and updated Current Outpatient Medications  Medication Sig Dispense Refill   amLODipine (NORVASC) 10 MG tablet TAKE 1 TABLET BY MOUTH EVERY DAY 90 tablet 3   chlorthalidone (HYGROTON) 25 MG tablet Take 1 tablet (25 mg total) by mouth daily. 90 tablet 3   etodolac (LODINE) 300 MG capsule Take 1 capsule (300 mg total) by mouth every 8 (eight) hours as needed (for sparing back pain). 30 capsule 1   fluticasone (FLONASE) 50 MCG/ACT nasal spray Place 2 sprays into both nostrils daily. 48 g 3   metFORMIN (GLUCOPHAGE) 500 MG tablet TAKE 1 TABLET BY MOUTH EVERY DAY WITH BREAKFAST 90 tablet 3   rosuvastatin (CRESTOR) 10 MG tablet Take 1 tablet (10 mg total) by mouth once a week. Take 1 tab weekly. 13 tablet 3   sildenafil (VIAGRA) 100 MG tablet Take 1 tablet (100 mg total) by mouth daily as needed for erectile dysfunction. 10 tablet 11   triamcinolone cream (KENALOG) 0.1 % Apply 1 application topically 2 (two) times daily. 10 days maximum for eczema flares 80 g 2   valACYclovir (VALTREX)  500 MG tablet TAKE 1 TABLET (500 MG TOTAL) BY MOUTH 2 (TWO) TIMES DAILY OR AS NEEDED FOR 3 DAYS DURING FLARE 180 tablet 1   vitamin B-12 (CYANOCOBALAMIN) 1000 MCG tablet Take 1,000 mcg by mouth 2 (two) times a week.     No current facility-administered medications for this visit.     Objective:  BP 132/84   Pulse 80   Temp 97.9 F (36.6 C)   Ht 5\' 9"  (1.753 m)   Wt 217 lb 12.8 oz (98.8 kg)   SpO2 97%   BMI 32.16 kg/m  Gen: NAD, resting comfortably Ext: no edema of the arm Skin: warm, dry MSK: Pain with resisted flexion of right wrist, pain with resisted supination.  Pain directly over right lateral epicondyle even with arm at rest.  No wrist pain.  No similar abnormalities of lateral epicondyle on the left    Assessment and Plan   #Right arm pain S: Pain started about 3 weeks ago after lifting something heavy like a speaker with arm out in front of him and wrist flexed.   He did not feel a pop or odd sensation during the lift. Anticipated it getting better- has had mild strains in past with weight lifting but usually got better but this has persisted. Hurts in upper forearm mainly with gripping.   -took an  etodolac which he has for his back but no significant improvement -sometimes sleeps with arm in awkward position and wondered if he irritated it.  A/P: Patient with pain over right lateral epicondyle consistent with lateral epicondylitis. From AVS "  Patient Instructions  Team please give him exercises for lateral epicondylitis/tennis elbow I want you to do the exercise 3x a week for a month then once a week for another month. Stop any exercise that causes more than 1-2/10 pain increase. If not doing better within 1-2 months let us refer you to sports medicine  Purchase counter force brace and wear in daytime. Avoid provocative activities for the pain. Ice if it does get provoked   Consider sports medicine referral if fail to improve or if your worsen  Recommended follow up:  Return for next already scheduled visit or sooner if needed. "    #hypertension S: medication: Amlodipine 10 mg, chlorthalidone 25 mg BP Readings from Last 3 Encounters:  09/26/22 132/84  07/05/22 116/70  01/04/22 120/86  A/P: Reasonably well-controlled on repeat-continue current medication    Recommended follow up: Return for next already scheduled visit or sooner if needed. Future Appointments  Date Time Provider [place]  01/08/2023  8:20 AM Marin Olp, MD [facility]   Lab/Order associations:   ICD-10-CM   1. Right lateral epicondylitis  M77.11     2. Primary hypertension  I10      Return precautions advised.  Garret Reddish, MD

## 2022-09-26 NOTE — Patient Instructions (Addendum)
Team please give him exercises for lateral epicondylitis/tennis elbow I want you to do the exercise 3x a week for a month then once a week for another month. Stop any exercise that causes more than 1-2/10 pain increase. If not doing better within 1-2 months let us refer you to sports medicine  Purchase counter force brace and wear in daytime. Avoid provocative activities for the pain. Ice if it does get provoked   Schedule lab visit for a week or so- no sex or vigorous exercise for 24-48 hours before test.   Consider sports medicine referral if fail to improve or if your worsen  Recommended follow up: Return for next already scheduled visit or sooner if needed.

## 2022-10-03 ENCOUNTER — Ambulatory Visit: Payer: 59 | Admitting: Family Medicine

## 2022-10-10 ENCOUNTER — Other Ambulatory Visit (INDEPENDENT_AMBULATORY_CARE_PROVIDER_SITE_OTHER): Payer: 59

## 2022-10-10 DIAGNOSIS — R972 Elevated prostate specific antigen [PSA]: Secondary | ICD-10-CM | POA: Diagnosis not present

## 2022-10-10 LAB — PSA: PSA: 3.26 ng/mL (ref 0.10–4.00)

## 2023-01-08 ENCOUNTER — Encounter: Payer: Self-pay | Admitting: Family Medicine

## 2023-01-08 ENCOUNTER — Ambulatory Visit: Payer: No Typology Code available for payment source | Admitting: Family Medicine

## 2023-01-08 VITALS — BP 142/92 | HR 74 | Temp 98.2°F | Ht 69.0 in | Wt 216.4 lb

## 2023-01-08 DIAGNOSIS — Z1211 Encounter for screening for malignant neoplasm of colon: Secondary | ICD-10-CM | POA: Diagnosis not present

## 2023-01-08 DIAGNOSIS — I10 Essential (primary) hypertension: Secondary | ICD-10-CM | POA: Diagnosis not present

## 2023-01-08 DIAGNOSIS — E785 Hyperlipidemia, unspecified: Secondary | ICD-10-CM | POA: Diagnosis not present

## 2023-01-08 DIAGNOSIS — E119 Type 2 diabetes mellitus without complications: Secondary | ICD-10-CM | POA: Diagnosis not present

## 2023-01-08 DIAGNOSIS — E1169 Type 2 diabetes mellitus with other specified complication: Secondary | ICD-10-CM | POA: Diagnosis not present

## 2023-01-08 LAB — CBC WITH DIFFERENTIAL/PLATELET
Basophils Absolute: 0 10*3/uL (ref 0.0–0.1)
Basophils Relative: 0.1 % (ref 0.0–3.0)
Eosinophils Absolute: 0.2 10*3/uL (ref 0.0–0.7)
Eosinophils Relative: 3.5 % (ref 0.0–5.0)
HCT: 44.6 % (ref 39.0–52.0)
Hemoglobin: 15.7 g/dL (ref 13.0–17.0)
Lymphocytes Relative: 39.8 % (ref 12.0–46.0)
Lymphs Abs: 1.9 10*3/uL (ref 0.7–4.0)
MCHC: 35.2 g/dL (ref 30.0–36.0)
MCV: 81.4 fl (ref 78.0–100.0)
Monocytes Absolute: 0.4 10*3/uL (ref 0.1–1.0)
Monocytes Relative: 8.1 % (ref 3.0–12.0)
Neutro Abs: 2.3 10*3/uL (ref 1.4–7.7)
Neutrophils Relative %: 48.5 % (ref 43.0–77.0)
Platelets: 406 10*3/uL — ABNORMAL HIGH (ref 150.0–400.0)
RBC: 5.48 Mil/uL (ref 4.22–5.81)
RDW: 13.9 % (ref 11.5–15.5)
WBC: 4.7 10*3/uL (ref 4.0–10.5)

## 2023-01-08 LAB — COMPREHENSIVE METABOLIC PANEL
ALT: 44 U/L (ref 0–53)
AST: 25 U/L (ref 0–37)
Albumin: 4.6 g/dL (ref 3.5–5.2)
Alkaline Phosphatase: 74 U/L (ref 39–117)
BUN: 11 mg/dL (ref 6–23)
CO2: 29 mEq/L (ref 19–32)
Calcium: 9.7 mg/dL (ref 8.4–10.5)
Chloride: 103 mEq/L (ref 96–112)
Creatinine, Ser: 0.87 mg/dL (ref 0.40–1.50)
GFR: 101.23 mL/min (ref >60.00)
Glucose, Bld: 126 mg/dL — ABNORMAL HIGH (ref 70–99)
Potassium: 4.1 mEq/L (ref 3.5–5.1)
Sodium: 139 mEq/L (ref 135–145)
Total Bilirubin: 1.2 mg/dL (ref 0.2–1.2)
Total Protein: 7.4 g/dL (ref 6.0–8.3)

## 2023-01-08 LAB — MICROALBUMIN / CREATININE URINE RATIO
Creatinine,U: 260.7 mg/dL
Microalb Creat Ratio: 1.7 mg/g (ref 0.0–30.0)
Microalb, Ur: 4.4 mg/dL — ABNORMAL HIGH (ref 0.0–1.9)

## 2023-01-08 LAB — HEMOGLOBIN A1C: Hgb A1c MFr Bld: 6.1 % (ref 4.6–6.5)

## 2023-01-08 NOTE — Progress Notes (Signed)
Phone [phone] In person visit   Subjective:   [name] [name] is a 52 y.o. year old very pleasant male patient who presents for/with See problem oriented charting Chief Complaint  Patient presents with   Follow-up   Diabetes   Hypertension   Past Medical History-  Patient Active Problem List   Diagnosis Date Noted   Low back pain without sciatica 10/24/2018    Priority: Medium    Atopic dermatitis 10/15/2017    Priority: Medium    Type 2 diabetes mellitus without complications ([place]) 21/30/8657    Priority: Medium    Hyperlipidemia associated with type 2 diabetes mellitus ([place] [place]) 09/16/2015    Priority: Medium    Hypertension 09/09/2014    Priority: Medium    Erectile dysfunction 01/08/2020    Priority: Low   Benign paroxysmal positional vertigo 11/03/2016    Priority: Low   Genital herpes 09/09/2014    Priority: Low    Medications- reviewed and updated Current Outpatient Medications  Medication Sig Dispense Refill   amLODipine (NORVASC) 10 MG tablet TAKE 1 TABLET BY MOUTH EVERY DAY 90 tablet 3   chlorthalidone (HYGROTON) 25 MG tablet Take 1 tablet (25 mg total) by mouth daily. 90 tablet 3   etodolac (LODINE) 300 MG capsule Take 1 capsule (300 mg total) by mouth every 8 (eight) hours as needed (for sparing back pain). 30 capsule 1   fluticasone (FLONASE) 50 MCG/ACT nasal spray Place 2 sprays into both nostrils daily. 48 g 3   metFORMIN (GLUCOPHAGE) 500 MG tablet TAKE 1 TABLET BY MOUTH EVERY DAY WITH BREAKFAST 90 tablet 3   rosuvastatin (CRESTOR) 10 MG tablet Take 1 tablet (10 mg total) by mouth once a week. Take 1 tab weekly. 13 tablet 3   sildenafil (VIAGRA) 100 MG tablet Take 1 tablet (100 mg total) by mouth daily as needed for erectile dysfunction. 10 tablet 11   triamcinolone cream (KENALOG) 0.1 % Apply 1 application topically 2 (two) times daily. 10 days maximum for eczema flares 80 g 2   valACYclovir (VALTREX) 500 MG tablet TAKE 1 TABLET (500 MG TOTAL) BY MOUTH  2 (TWO) TIMES DAILY OR AS NEEDED FOR 3 DAYS DURING FLARE 180 tablet 1   vitamin B-12 (CYANOCOBALAMIN) 1000 MCG tablet Take 1,000 mcg by mouth 2 (two) times a week.     No current facility-administered medications for this visit.     Objective:  BP (!) 142/92   Pulse 74   Temp 98.2 F (36.8 C)   Ht 5\' 9"  (1.753 m)   Wt 216 lb 6.4 oz (98.2 kg)   SpO2 97%   BMI 31.96 kg/m  Gen: NAD, resting comfortably CV: RRR no murmurs rubs or gallops Lungs: CTAB no crackles, wheeze, rhonchi Abdomen: soft/nontender/nondistended/normal bowel sounds. No rebound or guarding.  At right lower border of ribs he has pain with palpation- this reproducse pain he has reported.  Ext: no edema Skin: warm, dry     Assessment and Plan   #social update- moved and was sick over holidays- little tough stretch  #right lateral epicondylitis about 98% better- still guards it to be on safe side.   # Diabetes S: Medication:Metformin 500 mg daily- takes b12 while on this CBGs- does not check Exercise and diet- some weight loss but had stomach bug.  Lab Results  Component Value Date   HGBA1C 6.7 (H) 07/25/2022   HGBA1C 7.1 (H) 01/04/2022   HGBA1C 6.8 (H) 05/25/2021  A/P: hopefully stable- update a1c today. Continue  current meds for now  #hypertension S: medication: Amlodipine 10 mg, chlorthalidone 25 mg Home readings #s: often around 140/90 which is what we got today BP Readings from Last 3 Encounters:  01/08/23 (!) 142/92  09/26/22 132/84  07/05/22 116/70  A/P: blood pressure slightly high today and at home- we discussed option of adding valsartan 80mg . He feels with recent move that diet and exercise have not been where he has wanted- wants to focus on getting 150 minutes exercise per week and doing DASH eating plan   #hyperlipidemia S: Medication: rosuvastatin 40 mg weekly Lab Results  Component Value Date   CHOL 210 (H) 07/25/2022   HDL 41.10 07/25/2022   LDLCALC 132 (H) 07/25/2022   LDLDIRECT  159.0 01/04/2022   TRIG 181.0 (H) 07/25/2022   CHOLHDL 5 07/25/2022   A/P: significant improvement with once a week cholesterol medicine- we will recheck next physical. LDL down to 132 from 159 - still would prefer under 100 if possible - hoping dietary changes for hypertension will also help cholesterol.    # Right lateral flank pain S:had been in right low back in the past in the mornings for a few years inconsitently. Different from lower back pains he took etodolac. Pain up to 3-4/10 at times. Over the new year feeling it slightly worse coming and going. Not associated with food. Worse if sleeps on right side. Better if sleeps on left. Still mainly in the morning.   A/P: strongly suspect right back lower rib pain is musculoskeletal related - with prior high bilirubin we will recheck- could have radiating pain from gallstones but usually that would be more meal associated and not associated with laying on one side and worse in the morning -if he has worsening symptoms or more persistent return to see [place]- glad it has laid back off after holidays -could use wedge pillow to stay off right side -another option would be sports medicine consult- wonder if could benefit from osteopathic manipulation  #Low back pain-sparing etodolac 300 mg- still not needing much- has bene doing well lately   Recommended follow up: Return in about 3 months (around 04/09/2023) for followup or sooner if needed.Schedule b4 you leave. No future appointments.  Lab/Order associations:   ICD-10-CM   1. Primary hypertension  I10     2. Type 2 diabetes mellitus without complication, unspecified whether long term insulin use (HCC)  E11.9 Microalbumin / creatinine urine ratio    HgB A1c    Comprehensive metabolic panel    CBC with Differential/Platelet    3. Hyperlipidemia associated with type 2 diabetes mellitus ([facility])  E11.69    E78.5     4. Screen for colon cancer  Z12.11 Ambulatory referral to Gastroenterology       No orders of the defined types were placed in this encounter.   Return precautions advised.  Garret Reddish, MD

## 2023-01-08 NOTE — Patient Instructions (Addendum)
[facility] contact Please call to schedule visit and/or procedure Address: [address], [place], [place] [address] Phone: [phone]   blood pressure slightly high today and at home- we discussed option of adding valsartan 80mg . He feels with recent move that diet and exercise have not been where he has wanted- wants to focus on getting 150 minutes exercise per week and doing DASH eating plan. Bring log of blood pressures daily for at least 1-2 weeks before you come in  strongly suspect right back lower rib pain is musculoskeletal related - with prior high bilirubin we will recheck- could have radiating pain from gallstones but usually that would be more meal associated and not associated with laying on one side and worse in the morning -if he has worsening symptoms or more persistent return to see [place]- glad it has laid back off after holidays -could use wedge pillow to stay off right side -another option would be sports medicine consult- wonder if could benefit from osteopathic manipulation  Foot exam next visit  Please stop by lab before you go If you have mychart- we will send your results within 3 business days of [place] receiving them.  If you do not have mychart- we will call you about results within 5 business days of [place] receiving them.  *please also note that you will see labs on mychart as soon as they post. I will later go in and write notes on them- will say "notes from Dr. Yong Channel"   Recommended follow up: Return in about 3 months (around 04/09/2023) for followup or sooner if needed.Schedule b4 you leave.

## 2023-01-14 ENCOUNTER — Ambulatory Visit (INDEPENDENT_AMBULATORY_CARE_PROVIDER_SITE_OTHER): Payer: No Typology Code available for payment source | Admitting: Physician Assistant

## 2023-01-14 ENCOUNTER — Encounter: Payer: Self-pay | Admitting: Physician Assistant

## 2023-01-14 VITALS — BP 128/80 | HR 97 | Temp 98.2°F | Ht 69.0 in | Wt 216.5 lb

## 2023-01-14 DIAGNOSIS — U071 COVID-19: Secondary | ICD-10-CM | POA: Diagnosis not present

## 2023-01-14 LAB — POC COVID19 BINAXNOW: SARS Coronavirus 2 Ag: POSITIVE — AB

## 2023-01-14 MED ORDER — NIRMATRELVIR/RITONAVIR (PAXLOVID)TABLET: 3.0000 | ORAL_TABLET | Freq: Two times a day (BID) | ORAL | 0 refills | Status: AC

## 2023-01-14 NOTE — Patient Instructions (Signed)
It was great to see you!  HOLD your crestor and viagra now and may resume 1 week AFTER completing paxlovid  For current/suspected COVID symptoms: - Please watch closely for new onset shortness of breath, worsening shortness of breath, dizziness, confusion or any worsening symptoms. If any of these occur, please contact us during business hours, and if after business hours, please seek urgent care or go to the closest emergency room.  -Consider purchasing a pulse oximeter. If your levels are 94% or below persistently, please seek care at the hospital.   -If you test positive for COVID, everyone, regardless of vaccination status, should stay home for 5 days since symptom onset (or if asymptomatic, on day of positive test.) If you have no symptoms or your symptoms are resolving after 5 days, you can leave your house. Continue to wear a mask around others for 5 additional days. If you have a fever, continue to stay home until your fever resolves without use of medication.  -Please inform any contacts of your positive result so they can appropriately quarantine/test.  -Push fluids and try to eat small, frequent meals with protein to maintain your stamina.

## 2023-01-14 NOTE — Progress Notes (Signed)
[name] [name] is a 52 y.o. male here for a new problem.  History of Present Illness:   Chief Complaint  Patient presents with   Headache    Pt c/o headache, runny nose, slight cough, fatigue, body aches.    Headache     Flulike symptoms Patient reports sudden onset of headache, runny nose, cough, fatigue and body ache starting yesterday. He has history of COVID x 1 episode in the past He was at a funeral this weekend and thinks he may have been exposed to [place] there  He denies chest pain, shortness of breath, lower extremity swelling, history of blood clot  He has taken over-the-counter NyQuil for his symptoms  Past Medical History:  Diagnosis Date   Herpes    Hypertension      Social History   Tobacco Use   Smoking status: Never   Smokeless tobacco: Never  Substance Use Topics   Alcohol use: Yes    Alcohol/week: 1.0 standard drink of alcohol    Types: 1 Standard drinks or equivalent per week    Comment: max 3.    Drug use: No    Past Surgical History:  Procedure Laterality Date   none      Family History  Problem Relation Age of Onset   Diabetes Mother    Kidney disease Brother        transplant    Allergies  Allergen Reactions   Lisinopril     Vertigo thought potentially caused by lisinopril- resolved off med    Current Medications:   Current Outpatient Medications:    amLODipine (NORVASC) 10 MG tablet, TAKE 1 TABLET BY MOUTH EVERY DAY, Disp: 90 tablet, Rfl: 3   chlorthalidone (HYGROTON) 25 MG tablet, Take 1 tablet (25 mg total) by mouth daily., Disp: 90 tablet, Rfl: 3   etodolac (LODINE) 300 MG capsule, Take 1 capsule (300 mg total) by mouth every 8 (eight) hours as needed (for sparing back pain)., Disp: 30 capsule, Rfl: 1   fluticasone (FLONASE) 50 MCG/ACT nasal spray, Place 2 sprays into both nostrils daily., Disp: 48 g, Rfl: 3   metFORMIN (GLUCOPHAGE) 500 MG tablet, TAKE 1 TABLET BY MOUTH EVERY DAY WITH BREAKFAST, Disp: 90 tablet, Rfl: 3    nirmatrelvir/ritonavir (PAXLOVID) 20 x 150 MG & 10 x 100MG  TABS, Take 3 tablets by mouth 2 (two) times daily for 5 days. (Take nirmatrelvir 150 mg two tablets twice daily for 5 days and ritonavir 100 mg one tablet twice daily for 5 days), Disp: 30 tablet, Rfl: 0   rosuvastatin (CRESTOR) 10 MG tablet, Take 1 tablet (10 mg total) by mouth once a week. Take 1 tab weekly., Disp: 13 tablet, Rfl: 3   sildenafil (VIAGRA) 100 MG tablet, Take 1 tablet (100 mg total) by mouth daily as needed for erectile dysfunction., Disp: 10 tablet, Rfl: 11   triamcinolone cream (KENALOG) 0.1 %, Apply 1 application topically 2 (two) times daily. 10 days maximum for eczema flares, Disp: 80 g, Rfl: 2   valACYclovir (VALTREX) 500 MG tablet, TAKE 1 TABLET (500 MG TOTAL) BY MOUTH 2 (TWO) TIMES DAILY OR AS NEEDED FOR 3 DAYS DURING FLARE, Disp: 180 tablet, Rfl: 1   vitamin B-12 (CYANOCOBALAMIN) 1000 MCG tablet, Take 1,000 mcg by mouth 2 (two) times a week., Disp: , Rfl:    Review of Systems:   Review of Systems  Neurological:  Positive for headaches.   Negative unless otherwise specified per HPI.   Vitals:   Vitals:  01/14/23 1136  BP: 128/80  Pulse: 97  Temp: 98.2 F (36.8 C)  TempSrc: Temporal  SpO2: 98%  Weight: 216 lb 8 oz (98.2 kg)  Height: 5\' 9"  (1.753 m)     Body mass index is 31.97 kg/m.  Physical Exam:   Physical Exam Vitals and nursing note reviewed.  Constitutional:      General: He is not in acute distress.    Appearance: He is well-developed. He is not ill-appearing or toxic-appearing.  HENT:     Head: Normocephalic and atraumatic.     Right Ear: Tympanic membrane, ear canal and external ear normal. Tympanic membrane is not erythematous, retracted or bulging.     Left Ear: Tympanic membrane, ear canal and external ear normal. Tympanic membrane is not erythematous, retracted or bulging.     Nose: Nose normal.     Right Sinus: No maxillary sinus tenderness or frontal sinus tenderness.      Left Sinus: No maxillary sinus tenderness or frontal sinus tenderness.     Mouth/Throat:     Pharynx: Uvula midline. No posterior oropharyngeal erythema.  Eyes:     General: Lids are normal.     Conjunctiva/sclera: Conjunctivae normal.  Neck:     Trachea: Trachea normal.  Cardiovascular:     Rate and Rhythm: Normal rate and regular rhythm.     Heart sounds: Normal heart sounds, S1 normal and S2 normal.  Pulmonary:     Effort: Pulmonary effort is normal.     Breath sounds: Normal breath sounds. No decreased breath sounds, wheezing, rhonchi or rales.  Lymphadenopathy:     Cervical: No cervical adenopathy.  Skin:    General: Skin is warm and dry.  Neurological:     Mental Status: He is alert.  Psychiatric:        Speech: Speech normal.        Behavior: Behavior normal. Behavior is cooperative.    Results for orders placed or performed in visit on 01/14/23  POC COVID-19  Result Value Ref Range   SARS Coronavirus 2 Ag Positive (A) Negative     Assessment and Plan:   COVID-19 No red flags on exam he is in no acute distress Discussed options of trialing Paxlovid given his comorbidities Discussed that he needs to hold his statin and Viagra while taking Paxlovid Medication sent to pharmacy Push fluids and rest, may take Tylenol as needed Additional recommendations printed on [employer] Work note provided    Inda Coke, PA-C

## 2023-01-23 ENCOUNTER — Encounter: Payer: Self-pay | Admitting: Family Medicine

## 2023-01-25 ENCOUNTER — Other Ambulatory Visit: Payer: Self-pay | Admitting: Family Medicine

## 2023-04-11 ENCOUNTER — Ambulatory Visit: Payer: No Typology Code available for payment source | Admitting: Family Medicine

## 2023-05-09 ENCOUNTER — Ambulatory Visit: Payer: No Typology Code available for payment source | Admitting: Family Medicine

## 2023-06-19 ENCOUNTER — Encounter: Payer: Self-pay | Admitting: Family Medicine

## 2023-06-19 ENCOUNTER — Ambulatory Visit (INDEPENDENT_AMBULATORY_CARE_PROVIDER_SITE_OTHER): Payer: No Typology Code available for payment source | Admitting: Family Medicine

## 2023-06-19 VITALS — BP 124/78 | HR 73 | Temp 98.1°F | Ht 69.0 in | Wt 219.2 lb

## 2023-06-19 DIAGNOSIS — L2089 Other atopic dermatitis: Secondary | ICD-10-CM

## 2023-06-19 DIAGNOSIS — E119 Type 2 diabetes mellitus without complications: Secondary | ICD-10-CM

## 2023-06-19 DIAGNOSIS — I10 Essential (primary) hypertension: Secondary | ICD-10-CM

## 2023-06-19 DIAGNOSIS — E1169 Type 2 diabetes mellitus with other specified complication: Secondary | ICD-10-CM | POA: Diagnosis not present

## 2023-06-19 DIAGNOSIS — E785 Hyperlipidemia, unspecified: Secondary | ICD-10-CM

## 2023-06-19 DIAGNOSIS — B009 Herpesviral infection, unspecified: Secondary | ICD-10-CM

## 2023-06-19 DIAGNOSIS — Z7984 Long term (current) use of oral hypoglycemic drugs: Secondary | ICD-10-CM

## 2023-06-19 LAB — COMPREHENSIVE METABOLIC PANEL
ALT: 33 U/L (ref 0–53)
AST: 22 U/L (ref 0–37)
Albumin: 4.6 g/dL (ref 3.5–5.2)
Alkaline Phosphatase: 70 U/L (ref 39–117)
BUN: 10 mg/dL (ref 6–23)
CO2: 29 mEq/L (ref 19–32)
Calcium: 10.1 mg/dL (ref 8.4–10.5)
Chloride: 101 mEq/L (ref 96–112)
Creatinine, Ser: 0.82 mg/dL (ref 0.40–1.50)
GFR: 102.73 mL/min (ref >60.00)
Glucose, Bld: 106 mg/dL — ABNORMAL HIGH (ref 70–99)
Potassium: 4 mEq/L (ref 3.5–5.1)
Sodium: 137 mEq/L (ref 135–145)
Total Bilirubin: 1.7 mg/dL — ABNORMAL HIGH (ref 0.2–1.2)
Total Protein: 7.5 g/dL (ref 6.0–8.3)

## 2023-06-19 LAB — CBC WITH DIFFERENTIAL/PLATELET
Basophils Absolute: 0 10*3/uL (ref 0.0–0.1)
Basophils Relative: 0.2 % (ref 0.0–3.0)
Eosinophils Absolute: 0.1 10*3/uL (ref 0.0–0.7)
Eosinophils Relative: 3 % (ref 0.0–5.0)
HCT: 44.9 % (ref 39.0–52.0)
Hemoglobin: 15.7 g/dL (ref 13.0–17.0)
Lymphocytes Relative: 45.2 % (ref 12.0–46.0)
Lymphs Abs: 2.2 10*3/uL (ref 0.7–4.0)
MCHC: 35 g/dL (ref 30.0–36.0)
MCV: 82 fl (ref 78.0–100.0)
Monocytes Absolute: 0.3 10*3/uL (ref 0.1–1.0)
Monocytes Relative: 7.1 % (ref 3.0–12.0)
Neutro Abs: 2.2 10*3/uL (ref 1.4–7.7)
Neutrophils Relative %: 44.5 % (ref 43.0–77.0)
Platelets: 340 10*3/uL (ref 150.0–400.0)
RBC: 5.48 Mil/uL (ref 4.22–5.81)
RDW: 14.1 % (ref 11.5–15.5)
WBC: 4.9 10*3/uL (ref 4.0–10.5)

## 2023-06-19 LAB — HEMOGLOBIN A1C: Hgb A1c MFr Bld: 5.9 % (ref 4.6–6.5)

## 2023-06-19 MED ORDER — VALACYCLOVIR HCL 500 MG PO TABS
ORAL_TABLET | ORAL | 1 refills | Status: AC
Start: 2023-06-19 — End: ?

## 2023-06-19 MED ORDER — TRIAMCINOLONE ACETONIDE 0.1 % EX CREA
1.0000 | TOPICAL_CREAM | Freq: Two times a day (BID) | CUTANEOUS | 2 refills | Status: DC
Start: 2023-06-19 — End: 2024-02-10

## 2023-06-19 MED ORDER — ETODOLAC 300 MG PO CAPS: 300.0000 mg | ORAL_CAPSULE | Freq: Three times a day (TID) | ORAL | 1 refills | Status: AC | PRN

## 2023-06-19 MED ORDER — METFORMIN HCL 500 MG PO TABS: ORAL_TABLET | ORAL | 3 refills | Status: AC

## 2023-06-19 NOTE — Progress Notes (Signed)
Phone [phone] In person visit   Subjective:   [name] [name] is a 52 y.o. year old very pleasant male patient who presents for/with See problem oriented charting Chief Complaint  Patient presents with   Medical Management of Chronic Issues   Hypertension   Diabetes   Back Pain    Wants to know when back issues were first diagnosed    Past Medical History-  Patient Active Problem List   Diagnosis Date Noted   Low back pain without sciatica 10/24/2018    Priority: Medium    Atopic dermatitis 10/15/2017    Priority: Medium    Type 2 diabetes mellitus without complications (HCC) 09/16/2015    Priority: Medium    Hyperlipidemia associated with type 2 diabetes mellitus (HCC) 09/16/2015    Priority: Medium    Hypertension 09/09/2014    Priority: Medium    Erectile dysfunction 01/08/2020    Priority: Low   Benign paroxysmal positional vertigo 11/03/2016    Priority: Low   Genital herpes 09/09/2014    Priority: Low    Medications- reviewed and updated Current Outpatient Medications  Medication Sig Dispense Refill   amLODipine (NORVASC) 10 MG tablet TAKE ONE TABLET BY MOUTH ONE TIME DAILY 90 tablet 3   chlorthalidone (HYGROTON) 25 MG tablet Take 1 tablet (25 mg total) by mouth daily. 90 tablet 3   fluticasone (FLONASE) 50 MCG/ACT nasal spray USE TWO SPRAYS IN EACH NOSTRIL ONE TIME DAILY 48 mL 3   rosuvastatin (CRESTOR) 10 MG tablet TAKE ONE TABLET BY MOUTH EVERY WEEK 13 tablet 3   sildenafil (VIAGRA) 100 MG tablet TAKE ONE TABLET BY MOUTH ONE TIME DAILY AS NEEDED FOR ERECTILE DYSFUNCTION 10 tablet 11   vitamin B-12 (CYANOCOBALAMIN) 1000 MCG tablet Take 1,000 mcg by mouth 2 (two) times a week.     etodolac (LODINE) 300 MG capsule Take 1 capsule (300 mg total) by mouth every 8 (eight) hours as needed (for sparing back pain). 30 capsule 1   metFORMIN (GLUCOPHAGE) 500 MG tablet TAKE 1 TABLET BY MOUTH EVERY DAY WITH BREAKFAST 90 tablet 3   triamcinolone cream (KENALOG) 0.1 %  Apply 1 Application topically 2 (two) times daily. 10 days maximum for eczema flares 80 g 2   valACYclovir (VALTREX) 500 MG tablet TAKE 1 TABLET (500 MG TOTAL) BY MOUTH 2 (TWO) TIMES DAILY OR AS NEEDED FOR 3 DAYS DURING FLARE 180 tablet 1   No current facility-administered medications for this visit.     Objective:  BP 124/78   Pulse 73   Temp 98.1 F (36.7 C)   Ht 5\' 9"  (1.753 m)   Wt 219 lb 3.2 oz (99.4 kg)   SpO2 99%   BMI 32.37 kg/m  Gen: NAD, resting comfortably CV: RRR no murmurs rubs or gallops Lungs: CTAB no crackles, wheeze, rhonchi Ext: minimal edema Skin: warm, dry     Assessment and Plan   # Diabetes S: Medication:Metformin 500 mg daily- takes b12 while on this -interested in ozempic when more available- wants to still think this over  CBGs- does not check Exercise and diet- weight up 3 lbs from last visit. Exercise and diet have not been as good lately - plus was wearing steel toed shoes Lab Results  Component Value Date   HGBA1C 6.1 01/08/2023   HGBA1C 6.7 (H) 07/25/2022   HGBA1C 7.1 (H) 01/04/2022  A/P: diabetes hopefully stable- update a1c today. Continue current meds for now - hold off on change to Ozempic or mounjaro   #  hypertension S: medication: Amlodipine 10 mg, chlorthalidone 25 mg BP Readings from Last 3 Encounters:  06/19/23 124/78  01/14/23 128/80  01/08/23 (!) 142/92  A/P: stable- continue current medicines    #hyperlipidemia S: Medication: rosuvastatin 40 mg weekly Lab Results  Component Value Date   CHOL 210 (H) 07/25/2022   HDL 41.10 07/25/2022   LDLCALC 132 (H) 07/25/2022   LDLDIRECT 159.0 01/04/2022   TRIG 181.0 (H) 07/25/2022   CHOLHDL 5 07/25/2022   A/P: imperfect control but has wanted to work on lifestyle to bring this down- update lipids after next visit and if not progressing could bump dose slighlty   #Eczema-Eucerin as needed  in past - has been off lately and some flares- does need triamcinolone refill- provided and  encouraged regular emmollient use again   #Low back pain-sparing etodolac 300 mg- doing ok lately  -he asks today about origin of this pain timewise- we looked back and had x-rays of low bak as far as 03/05/2002- later we started discussing this more regularly in October 2018.   Recommended follow up: Return in about 4 months (around 10/19/2023) for physical or sooner if needed.Schedule b4 you leave.  Lab/Order associations:   ICD-10-CM   1. Type 2 diabetes mellitus without complication, unspecified whether long term insulin use (HCC)  E11.9 Comprehensive metabolic panel    CBC with Differential/Platelet    Hemoglobin A1c    2. Other atopic dermatitis  L20.89 triamcinolone cream (KENALOG) 0.1 %    3. Primary hypertension  I10     4. Hyperlipidemia associated with type 2 diabetes mellitus (HCC)  E11.69    E78.5     5. Herpes  B00.9 valACYclovir (VALTREX) 500 MG tablet      Meds ordered this encounter  Medications   triamcinolone cream (KENALOG) 0.1 %    Sig: Apply 1 Application topically 2 (two) times daily. 10 days maximum for eczema flares    Dispense:  80 g    Refill:  2   etodolac (LODINE) 300 MG capsule    Sig: Take 1 capsule (300 mg total) by mouth every 8 (eight) hours as needed (for sparing back pain).    Dispense:  30 capsule    Refill:  1   metFORMIN (GLUCOPHAGE) 500 MG tablet    Sig: TAKE 1 TABLET BY MOUTH EVERY DAY WITH BREAKFAST    Dispense:  90 tablet    Refill:  3   valACYclovir (VALTREX) 500 MG tablet    Sig: TAKE 1 TABLET (500 MG TOTAL) BY MOUTH 2 (TWO) TIMES DAILY OR AS NEEDED FOR 3 DAYS DURING FLARE    Dispense:  180 tablet    Refill:  1    Return precautions advised.  Tana Conch, MD

## 2023-06-19 NOTE — Patient Instructions (Addendum)
Get diabetic eye exam scheduled.  [facility] contact- overdue for colonoscopy Please call to schedule visit and/or procedure Address: [address] [place], [place], [place] [address] Phone: [phone]   Please stop by lab before you go If you have mychart- we will send your results within 3 business days of [place] receiving them.  If you do not have mychart- we will call you about results within 5 business days of [place] receiving them.  *please also note that you will see labs on mychart as soon as they post. I will later go in and write notes on them- will say "notes from Dr. Durene Cal"   Would love when thing ssettle at work for you to get back on regular exercise  Recommended follow up: Return in about 4 months (around 10/19/2023) for physical or sooner if needed.Schedule b4 you leave.  Come fasting for physical due to triglyceride(s)

## 2023-07-02 NOTE — Progress Notes (Shared)
[name] [name] is a 52 y.o. male here for a new problem.  History of Present Illness:   No chief complaint on file.   HPI  Upper Respiratory Infection   Back Pain Having pain in back since    Past Medical History:  Diagnosis Date   Herpes    Hypertension      Social History   Tobacco Use   Smoking status: Never   Smokeless tobacco: Never  Substance Use Topics   Alcohol use: Yes    Alcohol/week: 1.0 standard drink of alcohol    Types: 1 Standard drinks or equivalent per week    Comment: max 3.    Drug use: No    Past Surgical History:  Procedure Laterality Date   none      Family History  Problem Relation Age of Onset   Diabetes Mother    Kidney disease Brother        transplant    Allergies  Allergen Reactions   Lisinopril     Vertigo thought potentially caused by lisinopril- resolved off med    Current Medications:   Current Outpatient Medications:    amLODipine (NORVASC) 10 MG tablet, TAKE ONE TABLET BY MOUTH ONE TIME DAILY, Disp: 90 tablet, Rfl: 3   chlorthalidone (HYGROTON) 25 MG tablet, Take 1 tablet (25 mg total) by mouth daily., Disp: 90 tablet, Rfl: 3   etodolac (LODINE) 300 MG capsule, Take 1 capsule (300 mg total) by mouth every 8 (eight) hours as needed (for sparing back pain)., Disp: 30 capsule, Rfl: 1   fluticasone (FLONASE) 50 MCG/ACT nasal spray, USE TWO SPRAYS IN EACH NOSTRIL ONE TIME DAILY, Disp: 48 mL, Rfl: 3   metFORMIN (GLUCOPHAGE) 500 MG tablet, TAKE 1 TABLET BY MOUTH EVERY DAY WITH BREAKFAST, Disp: 90 tablet, Rfl: 3   rosuvastatin (CRESTOR) 10 MG tablet, TAKE ONE TABLET BY MOUTH EVERY WEEK, Disp: 13 tablet, Rfl: 3   sildenafil (VIAGRA) 100 MG tablet, TAKE ONE TABLET BY MOUTH ONE TIME DAILY AS NEEDED FOR ERECTILE DYSFUNCTION, Disp: 10 tablet, Rfl: 11   triamcinolone cream (KENALOG) 0.1 %, Apply 1 Application topically 2 (two) times daily. 10 days maximum for eczema flares, Disp: 80 g, Rfl: 2   valACYclovir (VALTREX) 500 MG  tablet, TAKE 1 TABLET (500 MG TOTAL) BY MOUTH 2 (TWO) TIMES DAILY OR AS NEEDED FOR 3 DAYS DURING FLARE, Disp: 180 tablet, Rfl: 1   vitamin B-12 (CYANOCOBALAMIN) 1000 MCG tablet, Take 1,000 mcg by mouth 2 (two) times a week., Disp: , Rfl:    Review of Systems:   ROS  Vitals:   There were no vitals filed for this visit.   There is no height or weight on file to calculate BMI.  Physical Exam:   Physical Exam  Assessment and Plan:   ***   I,Alexander Ruley,acting as a scribe for Jarold Motto, PA.,have documented all relevant documentation on the behalf of Jarold Motto, PA,as directed by  Jarold Motto, PA while in the presence of Jarold Motto, [place].   ***   Jarold Motto, PA-C

## 2023-07-03 ENCOUNTER — Ambulatory Visit: Payer: No Typology Code available for payment source | Admitting: Physician Assistant

## 2023-07-18 ENCOUNTER — Other Ambulatory Visit (HOSPITAL_COMMUNITY)
Admission: RE | Admit: 2023-07-18 | Discharge: 2023-07-18 | Disposition: A | Discharge status: Home / Self Care | Payer: No Typology Code available for payment source | Source: Ambulatory Visit | Attending: Family Medicine | Admitting: Family Medicine

## 2023-07-18 ENCOUNTER — Ambulatory Visit: Payer: No Typology Code available for payment source | Admitting: Physician Assistant

## 2023-07-18 ENCOUNTER — Encounter: Payer: Self-pay | Admitting: Physician Assistant

## 2023-07-18 VITALS — BP 138/80 | HR 72 | Temp 97.5°F | Ht 69.0 in | Wt 218.2 lb

## 2023-07-18 DIAGNOSIS — R300 Dysuria: Secondary | ICD-10-CM

## 2023-07-18 DIAGNOSIS — G8929 Other chronic pain: Secondary | ICD-10-CM

## 2023-07-18 DIAGNOSIS — M546 Pain in thoracic spine: Secondary | ICD-10-CM | POA: Diagnosis not present

## 2023-07-18 DIAGNOSIS — Z113 Encounter for screening for infections with a predominantly sexual mode of transmission: Secondary | ICD-10-CM

## 2023-07-18 LAB — POCT URINALYSIS DIPSTICK
Bilirubin, UA: NEGATIVE
Blood, UA: NEGATIVE
Glucose, UA: NEGATIVE
Ketones, UA: NEGATIVE
Leukocytes, UA: NEGATIVE
Nitrite, UA: NEGATIVE
Protein, UA: NEGATIVE
Spec Grav, UA: <=1.005 — AB (ref 1.010–1.025)
Urobilinogen, UA: 0.2 E.U./dL
pH, UA: 6 (ref 5.0–8.0)

## 2023-07-18 NOTE — Patient Instructions (Signed)
It was great to see you!  I will be in touch with all results  I do recommend that you follow-up with Dr Durene Cal to discuss your back pain and likely do PT  Take care,  Jarold Motto PA-C

## 2023-07-18 NOTE — Progress Notes (Signed)
[name] [name] is a 52 y.o. male here for a new problem.  History of Present Illness:   Chief Complaint  Patient presents with   Dysuria    Pt c/o burning with urination x 1 week   Flank Pain    Pt c/o right flank pain off and on the past year but is getting worse.    HPI  Chronic Back Pain: Complains of right-sided lower back pain that began years ago but has become constant over the last 2 weeks.   Reports exercise occasionally improves pain, and Aleve provides minimal relief.  Has discussed physical therapy with Tana Conch, MD but has yet to find time to do this. Notes tenderness on palpation. Denies strenuous activity that may have caused injury.  Dysuria: Reports experiencing dysuria and frequency. Notes pain occurs when finishing urination every time.  States he has been drinking more water over the last couple of days with no improvement. Reports no history of UTI.  Denies blood in urine, discharge from penis, flu-like symptoms, scrotum pressure, or abdominal pain.  He is requesting sexual transmitted infection testing today.  Past Medical History:  Diagnosis Date   Herpes    Hypertension      Social History   Tobacco Use   Smoking status: Never   Smokeless tobacco: Never  Substance Use Topics   Alcohol use: Yes    Alcohol/week: 1.0 standard drink of alcohol    Types: 1 Standard drinks or equivalent per week    Comment: max 3.    Drug use: No    Past Surgical History:  Procedure Laterality Date   none      Family History  Problem Relation Age of Onset   Diabetes Mother    Kidney disease Brother        transplant    Allergies  Allergen Reactions   Lisinopril     Vertigo thought potentially caused by lisinopril- resolved off med    Current Medications:   Current Outpatient Medications:    amLODipine (NORVASC) 10 MG tablet, TAKE ONE TABLET BY MOUTH ONE TIME DAILY, Disp: 90 tablet, Rfl: 3   chlorthalidone (HYGROTON) 25 MG tablet, Take 1  tablet (25 mg total) by mouth daily., Disp: 90 tablet, Rfl: 3   etodolac (LODINE) 300 MG capsule, Take 1 capsule (300 mg total) by mouth every 8 (eight) hours as needed (for sparing back pain)., Disp: 30 capsule, Rfl: 1   fluticasone (FLONASE) 50 MCG/ACT nasal spray, USE TWO SPRAYS IN EACH NOSTRIL ONE TIME DAILY, Disp: 48 mL, Rfl: 3   metFORMIN (GLUCOPHAGE) 500 MG tablet, TAKE 1 TABLET BY MOUTH EVERY DAY WITH BREAKFAST, Disp: 90 tablet, Rfl: 3   rosuvastatin (CRESTOR) 10 MG tablet, TAKE ONE TABLET BY MOUTH EVERY WEEK, Disp: 13 tablet, Rfl: 3   sildenafil (VIAGRA) 100 MG tablet, TAKE ONE TABLET BY MOUTH ONE TIME DAILY AS NEEDED FOR ERECTILE DYSFUNCTION, Disp: 10 tablet, Rfl: 11   triamcinolone cream (KENALOG) 0.1 %, Apply 1 Application topically 2 (two) times daily. 10 days maximum for eczema flares, Disp: 80 g, Rfl: 2   valACYclovir (VALTREX) 500 MG tablet, TAKE 1 TABLET (500 MG TOTAL) BY MOUTH 2 (TWO) TIMES DAILY OR AS NEEDED FOR 3 DAYS DURING FLARE, Disp: 180 tablet, Rfl: 1   vitamin B-12 (CYANOCOBALAMIN) 1000 MCG tablet, Take 1,000 mcg by mouth 2 (two) times a week., Disp: , Rfl:    Review of Systems:   Review of Systems  Genitourinary:  Positive for dysuria (  every time when finishing urination) and frequency.  Musculoskeletal:  Positive for back pain (constant since last 2 weeks).    Vitals:   Vitals:   07/18/23 1116  BP: 138/80  Pulse: 72  Temp: (!) 97.5 F (36.4 C)  TempSrc: Temporal  SpO2: 98%  Weight: 218 lb 4 oz (99 kg)  Height: 5\' 9"  (1.753 m)     Body mass index is 32.23 kg/m.  Physical Exam:   Physical Exam Vitals and nursing note reviewed.  Constitutional:      General: He is not in acute distress.    Appearance: He is well-developed. He is not ill-appearing or toxic-appearing.  Cardiovascular:     Rate and Rhythm: Normal rate and regular rhythm.     Pulses: Normal pulses.     Heart sounds: Normal heart sounds, S1 normal and S2 normal.  Pulmonary:     Effort:  Pulmonary effort is normal.     Breath sounds: Normal breath sounds.  Abdominal:     Tenderness: There is no right CVA tenderness or left CVA tenderness.  Musculoskeletal:     Comments: Right thoracic paraspinal tenderness at inferior aspect of ribcage  Skin:    General: Skin is warm and dry.  Neurological:     Mental Status: He is alert.     GCS: GCS eye subscore is 4. GCS verbal subscore is 5. GCS motor subscore is 6.  Psychiatric:        Speech: Speech normal.        Behavior: Behavior normal. Behavior is cooperative.    Results for orders placed or performed in visit on 07/18/23  POCT urinalysis dipstick  Result Value Ref Range   Color, UA yellow    Clarity, UA clear    Glucose, UA Negative Negative   Bilirubin, UA Negative    Ketones, UA Negative    Spec Grav, UA <=1.005 (A) 1.010 - 1.025   Blood, UA Negative    pH, UA 6.0 5.0 - 8.0   Protein, UA Negative Negative   Urobilinogen, UA 0.2 0.2 or 1.0 E.U./dL   Nitrite, UA Negative    Leukocytes, UA Negative Negative   Appearance     Odor      Assessment and Plan:   Dysuria UA normal Urine culture pending If new/worsening symptom(s), recommend reaching out to [place] for further evaluation  Screening examination for STD (sexually transmitted disease) Patient requesting this at the end of visit -- did not discuss more in detail  Chronic right sided thoracic back pain No red flags Recommend close follow-up with PCP for physical therapy or other treatment Consider imaging vs sports medicine visit Update kidney function per patient request   I,Emily Lagle,acting as a scribe for [employer], PA.,have documented all relevant documentation on the behalf of Jarold Motto, PA,as directed by  Jarold Motto, PA while in the presence of Jarold Motto, [place].  I, Jarold Motto, [place], have reviewed all documentation for this visit. The documentation on 07/18/23 for the exam, diagnosis, procedures, and orders are all  accurate and complete.  Jarold Motto, PA-C

## 2023-07-24 ENCOUNTER — Encounter (INDEPENDENT_AMBULATORY_CARE_PROVIDER_SITE_OTHER): Payer: Self-pay

## 2023-08-07 ENCOUNTER — Encounter: Payer: Self-pay | Admitting: Family Medicine

## 2023-08-07 ENCOUNTER — Ambulatory Visit (INDEPENDENT_AMBULATORY_CARE_PROVIDER_SITE_OTHER): Payer: No Typology Code available for payment source | Admitting: Family Medicine

## 2023-08-07 ENCOUNTER — Ambulatory Visit
Admission: RE | Admit: 2023-08-07 | Discharge: 2023-08-07 | Disposition: A | Discharge status: Home / Self Care | Payer: No Typology Code available for payment source | Source: Ambulatory Visit | Attending: Family Medicine | Admitting: Family Medicine

## 2023-08-07 VITALS — BP 134/88 | HR 78 | Temp 98.4°F | Ht 69.0 in | Wt 217.2 lb

## 2023-08-07 DIAGNOSIS — M545 Low back pain, unspecified: Secondary | ICD-10-CM

## 2023-08-07 DIAGNOSIS — M546 Pain in thoracic spine: Secondary | ICD-10-CM | POA: Diagnosis not present

## 2023-08-07 DIAGNOSIS — I10 Essential (primary) hypertension: Secondary | ICD-10-CM

## 2023-08-07 DIAGNOSIS — G8929 Other chronic pain: Secondary | ICD-10-CM

## 2023-08-07 NOTE — Patient Instructions (Addendum)
[facility] contact- really want you to get this done with back issues Please call to schedule visit and/or procedure Address: [address] [place], [place], [place] [address] Phone: [phone]   Please go to   central X-ray (updated 02/18/2020) - located [address] across the street from [facility] - in the basement - Hours: 8:30-5:00 PM M-F (with lunch from 12:30- 1 PM). You do NOT need an appointment.    Likely refer to sports medicine but you wanted to wait on the x-ray report first- may be about a week to 10 days  Get diabetic eye exam scheduled (we are doing them here in clinic on 09/26).  Let us know if you get a flu or COVID vaccine this fall.  Recommended follow up: Return for next already scheduled visit or sooner if needed.

## 2023-08-07 NOTE — Progress Notes (Signed)
Phone [phone] In person visit   Subjective:   [name] [name] is a 52 y.o. year old very pleasant male patient who presents for/with See problem oriented charting Chief Complaint  Patient presents with   Back Pain    Pt c/o lower back pain on the right side that has been there for a while with a slight tingling sensation .   Past Medical History-  Patient Active Problem List   Diagnosis Date Noted   Low back pain without sciatica 10/24/2018    Priority: Medium    Atopic dermatitis 10/15/2017    Priority: Medium    Type 2 diabetes mellitus without complications (HCC) 09/16/2015    Priority: Medium    Hyperlipidemia associated with type 2 diabetes mellitus (HCC) 09/16/2015    Priority: Medium    Hypertension 09/09/2014    Priority: Medium    Erectile dysfunction 01/08/2020    Priority: Low   Benign paroxysmal positional vertigo 11/03/2016    Priority: Low   Genital herpes 09/09/2014    Priority: Low    Medications- reviewed and updated Current Outpatient Medications  Medication Sig Dispense Refill   amLODipine (NORVASC) 10 MG tablet TAKE ONE TABLET BY MOUTH ONE TIME DAILY 90 tablet 3   chlorthalidone (HYGROTON) 25 MG tablet Take 1 tablet (25 mg total) by mouth daily. 90 tablet 3   etodolac (LODINE) 300 MG capsule Take 1 capsule (300 mg total) by mouth every 8 (eight) hours as needed (for sparing back pain). 30 capsule 1   fluticasone (FLONASE) 50 MCG/ACT nasal spray USE TWO SPRAYS IN EACH NOSTRIL ONE TIME DAILY 48 mL 3   metFORMIN (GLUCOPHAGE) 500 MG tablet TAKE 1 TABLET BY MOUTH EVERY DAY WITH BREAKFAST 90 tablet 3   rosuvastatin (CRESTOR) 10 MG tablet TAKE ONE TABLET BY MOUTH EVERY WEEK 13 tablet 3   sildenafil (VIAGRA) 100 MG tablet TAKE ONE TABLET BY MOUTH ONE TIME DAILY AS NEEDED FOR ERECTILE DYSFUNCTION 10 tablet 11   triamcinolone cream (KENALOG) 0.1 % Apply 1 Application topically 2 (two) times daily. 10 days maximum for eczema flares 80 g 2   valACYclovir  (VALTREX) 500 MG tablet TAKE 1 TABLET (500 MG TOTAL) BY MOUTH 2 (TWO) TIMES DAILY OR AS NEEDED FOR 3 DAYS DURING FLARE 180 tablet 1   vitamin B-12 (CYANOCOBALAMIN) 1000 MCG tablet Take 1,000 mcg by mouth 2 (two) times a week.     No current facility-administered medications for this visit.     Objective:  BP 134/88 Comment: took etodolac this morning and in pain as well  Pulse 78   Temp 98.4 F (36.9 C)   Ht 5\' 9"  (1.753 m)   Wt 217 lb 3.2 oz (98.5 kg)   SpO2 98%   BMI 32.07 kg/m  Gen: NAD, resting comfortably CV: RRR no murmurs rubs or gallops Lungs: CTAB no crackles, wheeze, rhonchi Abdomen: soft/nontender/nondistended/normal bowel sounds. No rebound or guarding.  Ext: no edema Skin: warm, dry Back - Normal skin, Spine with normal alignment and no deformity.  No tenderness to vertebral process palpation.  Paraspinous muscles are not tender and without spasm except with some pain to right of mid thoracic spine.   Range of motion is full at neck and lumbar sacral regions.  Interestingly pain improves with palpation more laterally of one of his lower ribs Neuro- no saddle anesthesia, normal strength lower extremities     Assessment and Plan    # Right lower back pain intermittent years but somewhat better  lately # Right thoracic back pain S: Patient reports pain in right lower back present for at least since 2018 (we have discussed pain intermittently since that time and he takes sparing etodolac) but has x-rays dating back to March 05, 2002 of low back  Today notes that pain has become more persistent in last month and new location- seems to be more thoracic spine region. No fall or injury. Mainly right low back- if he presses on a certain spot relieves pain. Does hurt some in midback and gets radiation around to right side/flank. Laying on right side makes it worse, laying on left helps. Etodolac takes edge off. At its worst up to 8/10 but constant at 4/10 unless he presses on it.  Gets tingling into the right side/flank.   ROS-No saddle anesthesia, bladder incontinence, fecal incontinence, weakness in extremity, numbness or tingling in extremity. History negative for history of cancer, fever, chills, unintentional weight loss, recent bacterial infection, recent IV drug use, HIV, pain worse at night or while supine- unless he lays on his side . No dysuria or polyuria recently- had that previously inlate July and was seen by Jarold Motto, PA - sexual transmitted infection testing negative, urine culture negative, CBC and CMP reassuring and was having the pain at that time. PSA trend has been low risk for prostate cancer A/P: New onset right thoracic back pain in patient with history of intermittent low back pain.  Recently had labs including CBC and CMP as well as urinalysis and urine culture that was reassuring.  We opted to update his thoracic and lumbar spine films and discussed possible referral to sports medicine-he wants to wait on the imaging before making this final decision -Given the fact that his pain improved significantly with pressing on on the lower ribs also wonder about rib dysfunction-we discussed sports medicine may be able to help [place] with this as well   #hypertension S: medication: Amlodipine 10 mg, chlorthalidone 25 mg BP Readings from Last 3 Encounters:  08/07/23 134/88  07/18/23 138/80  06/19/23 124/78   A/P: High acceptable blood pressure-continue current medication for now-pain may be slightly worse with etodolac as well as dealing with pain at present    Recommended follow up: Return for next already scheduled visit or sooner if needed. Future Appointments  Date Time Provider Department Center  01/08/2024  9:00 AM Shelva Majestic, MD [facility]   Lab/Order associations:   ICD-10-CM   1. Chronic right-sided thoracic back pain  M54.6 DG Thoracic Spine W/Swimmers   G89.29     2. Chronic bilateral low back pain without sciatica  M54.50 DG  Lumbar Spine Complete   G89.29     3. Primary hypertension  I10      Return precautions advised.  Tana Conch, MD

## 2023-12-04 ENCOUNTER — Other Ambulatory Visit: Payer: Self-pay | Admitting: Family Medicine

## 2023-12-24 ENCOUNTER — Ambulatory Visit (INDEPENDENT_AMBULATORY_CARE_PROVIDER_SITE_OTHER): Payer: No Typology Code available for payment source | Admitting: Physician Assistant

## 2023-12-24 ENCOUNTER — Encounter: Payer: Self-pay | Admitting: Physician Assistant

## 2023-12-24 VITALS — BP 138/80 | HR 86 | Ht 69.0 in | Wt 221.4 lb

## 2023-12-24 DIAGNOSIS — R059 Cough, unspecified: Secondary | ICD-10-CM

## 2023-12-24 DIAGNOSIS — R229 Localized swelling, mass and lump, unspecified: Secondary | ICD-10-CM

## 2023-12-24 LAB — POC COVID19 BINAXNOW: SARS Coronavirus 2 Ag: NEGATIVE

## 2023-12-24 NOTE — Patient Instructions (Signed)
 It was great to see you!  You have a viral upper respiratory infection. Antibiotics are not needed for this.  Viral infections usually take 7-10 days to resolve.  The cough can last a few weeks to go away.  You can use Robitussin DM Mucinex and Mucinex DM for cough. Generic alternatives are fine.  Push fluids and get plenty of rest. Please return if you are not improving as expected, or if you have high fevers (>101.5) or difficulty swallowing or worsening productive cough.  Call clinic with questions.  Please follow-up with Dr Katrinka about the nodule on your neck.  I hope you start feeling better soon!

## 2023-12-24 NOTE — Progress Notes (Signed)
 [name] [name] is a 52 y.o. male here for a new problem.  History of Present Illness:   Chief Complaint  Patient presents with  . Sinus Problem    Pt c/o cough, expectorating yellow sputum, sinus headache, nasal congestion, fatigue, body aches. Symptoms started on Saturday evening. Denies fever or chills.   HPI  Sinus Pressure: Complains of sinus headache with associated nasal congestion, cough with expectoration of yellow sputum, fatigue, and body aches.  Reports his symptoms began Saturday night/Sunday morning and have been improving since onset.  Has been using Nyquil for his symptoms.  Does endorse sick contact while at work.   Did have to miss work yesterday due to his symptoms.  Notes sinus headache improving.  Endorses severe sore throat on Sunday that has improved.  Denies fever/chills or ear pain/pressure/crackling/popping.  Mass Complains of minor mass behind his right ear that he noticed a couple of months ago. Notes no change in size since noticing.  Denies unintentional weight loss, night sweats.  Past Medical History:  Diagnosis Date  . Herpes   . Hypertension     Social History   Tobacco Use  . Smoking status: Never  . Smokeless tobacco: Never  Substance Use Topics  . Alcohol use: Yes    Alcohol/week: 1.0 standard drink of alcohol    Types: 1 Standard drinks or equivalent per week    Comment: max 3.   . Drug use: No   Past Surgical History:  Procedure Laterality Date  . none     Family History  Problem Relation Age of Onset  . Diabetes Mother   . Kidney disease Brother        transplant   Allergies  Allergen Reactions  . Lisinopril      Vertigo thought potentially caused by lisinopril - resolved off med   Current Medications:   Current Outpatient Medications:  .  amLODipine  (NORVASC ) 10 MG tablet, TAKE ONE TABLET BY MOUTH ONE TIME DAILY, Disp: 90 tablet, Rfl: 3 .  chlorthalidone  (HYGROTON ) 25 MG tablet, Take 1 tablet (25 mg total) by  mouth daily., Disp: 90 tablet, Rfl: 3 .  etodolac  (LODINE ) 300 MG capsule, Take 1 capsule (300 mg total) by mouth every 8 (eight) hours as needed (for sparing back pain)., Disp: 30 capsule, Rfl: 1 .  fluticasone  (FLONASE ) 50 MCG/ACT nasal spray, USE TWO SPRAYS IN EACH NOSTRIL ONE TIME DAILY, Disp: 48 mL, Rfl: 3 .  metFORMIN  (GLUCOPHAGE ) 500 MG tablet, TAKE 1 TABLET BY MOUTH EVERY DAY WITH BREAKFAST, Disp: 90 tablet, Rfl: 3 .  rosuvastatin  (CRESTOR ) 10 MG tablet, TAKE ONE TABLET BY MOUTH EVERY WEEK, Disp: 13 tablet, Rfl: 3 .  sildenafil  (VIAGRA ) 100 MG tablet, TAKE ONE TABLET BY MOUTH ONE TIME DAILY AS NEEDED FOR ERECTILE DYSFUNCTION, Disp: 10 tablet, Rfl: 11 .  triamcinolone  cream (KENALOG ) 0.1 %, Apply 1 Application topically 2 (two) times daily. 10 days maximum for eczema flares, Disp: 80 g, Rfl: 2 .  valACYclovir  (VALTREX ) 500 MG tablet, TAKE 1 TABLET (500 MG TOTAL) BY MOUTH 2 (TWO) TIMES DAILY OR AS NEEDED FOR 3 DAYS DURING FLARE, Disp: 180 tablet, Rfl: 1 .  vitamin B-12 (CYANOCOBALAMIN ) 1000 MCG tablet, Take 1,000 mcg by mouth 2 (two) times a week., Disp: , Rfl:   Review of Systems:   ROS See pertinent positives and negatives as per the HPI.  Vitals:   Vitals:   12/24/23 0820  BP: 138/80  Pulse: 86  SpO2: 99%  Weight: 221 lb 6.1  oz (100.4 kg)  Height: 5' 9 (1.753 m)     Body mass index is 32.69 kg/m.  Physical Exam:   Physical Exam Vitals and nursing note reviewed.  Constitutional:      General: He is not in acute distress.    Appearance: He is well-developed. He is not ill-appearing or toxic-appearing.  HENT:     Head: Normocephalic and atraumatic.     Right Ear: Tympanic membrane, ear canal and external ear normal. Tympanic membrane is not erythematous, retracted or bulging.     Left Ear: Tympanic membrane, ear canal and external ear normal. Tympanic membrane is not erythematous, retracted or bulging.     Nose: Nose normal.     Right Sinus: No maxillary sinus tenderness  or frontal sinus tenderness.     Left Sinus: No maxillary sinus tenderness or frontal sinus tenderness.     Mouth/Throat:     Pharynx: Uvula midline. Posterior oropharyngeal erythema present.     Tonsils: No tonsillar exudate. 1+ on the right. 1+ on the left.  Eyes:     General: Lids are normal.     Conjunctiva/sclera: Conjunctivae normal.  Neck:     Trachea: Trachea normal.  Cardiovascular:     Rate and Rhythm: Normal rate and regular rhythm.     Heart sounds: Normal heart sounds, S1 normal and S2 normal.  Pulmonary:     Effort: Pulmonary effort is normal.     Breath sounds: Normal breath sounds. No decreased breath sounds, wheezing, rhonchi or rales.  Lymphadenopathy:     Cervical: No cervical adenopathy.  Skin:    General: Skin is warm and dry.     Comments: Approximately 1/2 cm nodule to right posterior neck; no tenderness to palpation   Neurological:     Mental Status: He is alert.  Psychiatric:        Speech: Speech normal.        Behavior: Behavior normal. Behavior is cooperative.    Results for orders placed or performed in visit on 12/24/23  POC COVID-19  Result Value Ref Range   SARS Coronavirus 2 Ag Negative Negative    Assessment and Plan:   Cough, unspecified type No red flags on exam.   Suspect viral upper respiratory infection (URI)  Discussed taking medications as prescribed.  Recommend OTC (available over the counter without a prescription) mucinex to help loosen and expel congestion Reviewed return precautions including new or worsening fever, SOB, new or worsening cough or other concerns.  Push fluids and rest.  I recommend that patient follow-up if symptoms worsen or persist despite treatment x 7-10 days, sooner if needed.   Localized skin mass, lump, or swelling Has appointment with PCP in two weeks -- recommend close follow-up with him to further evaluate    I,Emily Lagle,acting as a scribe for [employer], PA.,have documented all relevant  documentation on the behalf of Lucie Buttner, PA,as directed by  Lucie Buttner, PA while in the presence of Lucie Buttner, [place].  I, Lucie Buttner, [place], have reviewed all documentation for this visit. The documentation on 12/24/23 for the exam, diagnosis, procedures, and orders are all accurate and complete.  Lucie Buttner, PA-C

## 2024-01-08 ENCOUNTER — Encounter: Payer: No Typology Code available for payment source | Admitting: Family Medicine

## 2024-01-22 LAB — HM DIABETES EYE EXAM

## 2024-01-23 ENCOUNTER — Ambulatory Visit: Payer: BC Managed Care – PPO | Admitting: Family Medicine

## 2024-01-23 ENCOUNTER — Encounter: Payer: Self-pay | Admitting: Family Medicine

## 2024-01-23 VITALS — BP 130/80 | HR 85 | Temp 98.4°F | Ht 69.0 in | Wt 221.4 lb

## 2024-01-23 DIAGNOSIS — E538 Deficiency of other specified B group vitamins: Secondary | ICD-10-CM | POA: Diagnosis not present

## 2024-01-23 DIAGNOSIS — L989 Disorder of the skin and subcutaneous tissue, unspecified: Secondary | ICD-10-CM

## 2024-01-23 DIAGNOSIS — I10 Essential (primary) hypertension: Secondary | ICD-10-CM | POA: Diagnosis not present

## 2024-01-23 DIAGNOSIS — E119 Type 2 diabetes mellitus without complications: Secondary | ICD-10-CM | POA: Diagnosis not present

## 2024-01-23 DIAGNOSIS — Z125 Encounter for screening for malignant neoplasm of prostate: Secondary | ICD-10-CM

## 2024-01-23 DIAGNOSIS — E785 Hyperlipidemia, unspecified: Secondary | ICD-10-CM

## 2024-01-23 DIAGNOSIS — Z1211 Encounter for screening for malignant neoplasm of colon: Secondary | ICD-10-CM

## 2024-01-23 DIAGNOSIS — Z7984 Long term (current) use of oral hypoglycemic drugs: Secondary | ICD-10-CM

## 2024-01-23 DIAGNOSIS — E1169 Type 2 diabetes mellitus with other specified complication: Secondary | ICD-10-CM | POA: Diagnosis not present

## 2024-01-23 NOTE — Progress Notes (Signed)
Phone [phone] In person visit   Subjective:   [name] [name] is a 53 y.o. year old very pleasant male patient who presents for/with See problem oriented charting Chief Complaint  Patient presents with   Medical Management of Chronic Issues   Diabetes   Hypertension   bump on back of head    Pt c/o bump on back of right side of head that wont go away.    Past Medical History-  Patient Active Problem List   Diagnosis Date Noted   Low back pain without sciatica 10/24/2018    Priority: Medium    Atopic dermatitis 10/15/2017    Priority: Medium    Type 2 diabetes mellitus without complications (HCC) 09/16/2015    Priority: Medium    Hyperlipidemia associated with type 2 diabetes mellitus (HCC) 09/16/2015    Priority: Medium    Hypertension 09/09/2014    Priority: Medium    Erectile dysfunction 01/08/2020    Priority: Low   Benign paroxysmal positional vertigo 11/03/2016    Priority: Low   Genital herpes 09/09/2014    Priority: Low    Medications- reviewed and updated Current Outpatient Medications  Medication Sig Dispense Refill   amLODipine (NORVASC) 10 MG tablet TAKE ONE TABLET BY MOUTH ONE TIME DAILY 90 tablet 3   chlorthalidone (HYGROTON) 25 MG tablet Take 1 tablet (25 mg total) by mouth daily. 90 tablet 3   etodolac (LODINE) 300 MG capsule Take 1 capsule (300 mg total) by mouth every 8 (eight) hours as needed (for sparing back pain). 30 capsule 1   fluticasone (FLONASE) 50 MCG/ACT nasal spray USE TWO SPRAYS IN EACH NOSTRIL ONE TIME DAILY 48 mL 3   metFORMIN (GLUCOPHAGE) 500 MG tablet TAKE 1 TABLET BY MOUTH EVERY DAY WITH BREAKFAST 90 tablet 3   rosuvastatin (CRESTOR) 10 MG tablet TAKE ONE TABLET BY MOUTH EVERY WEEK 13 tablet 3   sildenafil (VIAGRA) 100 MG tablet TAKE ONE TABLET BY MOUTH ONE TIME DAILY AS NEEDED FOR ERECTILE DYSFUNCTION 10 tablet 11   triamcinolone cream (KENALOG) 0.1 % Apply 1 Application topically 2 (two) times daily. 10 days maximum for  eczema flares 80 g 2   valACYclovir (VALTREX) 500 MG tablet TAKE 1 TABLET (500 MG TOTAL) BY MOUTH 2 (TWO) TIMES DAILY OR AS NEEDED FOR 3 DAYS DURING FLARE 180 tablet 1   vitamin B-12 (CYANOCOBALAMIN) 1000 MCG tablet Take 1,000 mcg by mouth 2 (two) times a week.     No current facility-administered medications for this visit.     Objective:  BP 130/80   Pulse 85   Temp 98.4 F (36.9 C)   Ht 5\' 9"  (1.753 m)   Wt 221 lb 6.4 oz (100.4 kg)   SpO2 97%   BMI 32.70 kg/m  Gen: NAD, resting comfortably CV: RRR no murmurs rubs or gallops Lungs: CTAB no crackles, wheeze, rhonchi  Ext: no edema Skin: warm, dry  About 1.5 x 1.5 cm somewhat firm but mobile subcutaneous lesion     Assessment and Plan   # Subcutaneous lesion on right scalp near occiput  S: noted on right neck behind ear maybe 1.5-2 months but may have been there longer. Not growing. Thought he could pop it but unable. Not having pain A/P: subcutaneous lesion- suspect cyst- send to dermatology for removal- feels to firm for lymph node and too superficial   # Diabetes S: Medication:Metformin 500 mg daily- takes b12 while on this -had eye appointment yesterday -doesn't check sugar Exercise and  diet- encouraged again to add exercise in. Up slightly on weight too- discussed reversing trend Lab Results  Component Value Date   HGBA1C 5.9 06/19/2023   HGBA1C 6.1 01/08/2023   HGBA1C 6.7 (H) 07/25/2022  A/P: hopefully stable- update a1c today. Continue current meds for now  - check B12 with long term metformin and off of B12 lately   #hypertension S: medication: Amlodipine 10 mg, chlorthalidone 25 mg BP Readings from Last 3 Encounters:  01/23/24 130/80  12/24/23 138/80  08/07/23 134/88  A/P: stable- continue current medicines    #hyperlipidemia S: Medication: rosuvastatin 10 mg weekly Lab Results  Component Value Date   CHOL 210 (H) 07/25/2022   HDL 41.10 07/25/2022   LDLCALC 132 (H) 07/25/2022   LDLDIRECT 159.0  01/04/2022   TRIG 181.0 (H) 07/25/2022   CHOLHDL 5 07/25/2022  A/P: hopefully stable or improved- update lipids   #Low back pain-sparing etodolac 300 mg- not needing lately - check CMP   #Genital Herpes- taking Valacyclovir 500 mg prn. No outbreaks recently. Has med refill if needed.    #prostate cancer screening-slightly overdue for annual check Lab Results  Component Value Date   PSA 3.26 10/10/2022   PSA 3.59 07/25/2022   PSA 3.17 01/04/2022    Recommended follow up: Return for next already scheduled visit or sooner if needed. Future Appointments  Date Time Provider Department Center  07/10/2024  2:00 PM Shelva Majestic, MD [facility]    Lab/Order associations: fasting other than a few chips very early in the day   ICD-10-CM   1. Type 2 diabetes mellitus without complication, unspecified whether long term insulin use (HCC)  E11.9 Urine Microalbumin w/creat. ratio    Hemoglobin A1c    2. Primary hypertension  I10     3. Hyperlipidemia associated with type 2 diabetes mellitus (HCC)  E11.69 Comprehensive metabolic panel   E95.2 CBC with Differential/Platelet    Lipid panel    4. Screen for colon cancer  Z12.11 Ambulatory referral to Gastroenterology    5. Screening for prostate cancer  Z12.5 PSA    6. Lesion of subcutaneous tissue  L98.9 Ambulatory referral to Dermatology    7. B12 deficiency  E53.8 Vitamin B12      No orders of the defined types were placed in this encounter.   Return precautions advised.  Tana Conch, MD

## 2024-01-23 NOTE — Patient Instructions (Addendum)
[facility] contact- call if you don't hear within a week Please call to schedule visit and/or procedure Address: [address] [place], [place], [place] [address] Phone: [phone]   Call # listed below for dermatology if you do not hear within a week  Please stop by lab before you go If you have mychart- we will send your results within 3 business days of [place] receiving them.  If you do not have mychart- we will call you about results within 5 business days of [place] receiving them.  *please also note that you will see labs on mychart as soon as they post. I will later go in and write notes on them- will say "notes from Dr. Durene Cal"   Recommended follow up: Return for next already scheduled visit or sooner if needed.

## 2024-01-24 ENCOUNTER — Other Ambulatory Visit: Payer: Self-pay

## 2024-01-24 ENCOUNTER — Encounter: Payer: Self-pay | Admitting: Family Medicine

## 2024-01-24 DIAGNOSIS — R972 Elevated prostate specific antigen [PSA]: Secondary | ICD-10-CM

## 2024-01-24 LAB — COMPREHENSIVE METABOLIC PANEL
ALT: 32 U/L (ref 0–53)
AST: 20 U/L (ref 0–37)
Albumin: 4.6 g/dL (ref 3.5–5.2)
Alkaline Phosphatase: 78 U/L (ref 39–117)
BUN: 9 mg/dL (ref 6–23)
CO2: 29 meq/L (ref 19–32)
Calcium: 9.5 mg/dL (ref 8.4–10.5)
Chloride: 102 meq/L (ref 96–112)
Creatinine, Ser: 0.84 mg/dL (ref 0.40–1.50)
GFR: 101.56 mL/min (ref >60.00)
Glucose, Bld: 83 mg/dL (ref 70–99)
Potassium: 3.8 meq/L (ref 3.5–5.1)
Sodium: 139 meq/L (ref 135–145)
Total Bilirubin: 1.4 mg/dL — ABNORMAL HIGH (ref 0.2–1.2)
Total Protein: 7.2 g/dL (ref 6.0–8.3)

## 2024-01-24 LAB — CBC WITH DIFFERENTIAL/PLATELET
Basophils Absolute: 0.1 10*3/uL (ref 0.0–0.1)
Basophils Relative: 1 % (ref 0.0–3.0)
Eosinophils Absolute: 0.2 10*3/uL (ref 0.0–0.7)
Eosinophils Relative: 3.7 % (ref 0.0–5.0)
HCT: 43.8 % (ref 39.0–52.0)
Hemoglobin: 15.4 g/dL (ref 13.0–17.0)
Lymphocytes Relative: 47 % — ABNORMAL HIGH (ref 12.0–46.0)
Lymphs Abs: 2.9 10*3/uL (ref 0.7–4.0)
MCHC: 35.1 g/dL (ref 30.0–36.0)
MCV: 82.2 fL (ref 78.0–100.0)
Monocytes Absolute: 0.4 10*3/uL (ref 0.1–1.0)
Monocytes Relative: 7.3 % (ref 3.0–12.0)
Neutro Abs: 2.5 10*3/uL (ref 1.4–7.7)
Neutrophils Relative %: 41 % — ABNORMAL LOW (ref 43.0–77.0)
Platelets: 321 10*3/uL (ref 150.0–400.0)
RBC: 5.32 Mil/uL (ref 4.22–5.81)
RDW: 13.3 % (ref 11.5–15.5)
WBC: 6.1 10*3/uL (ref 4.0–10.5)

## 2024-01-24 LAB — LIPID PANEL
Cholesterol: 188 mg/dL (ref 0–200)
HDL: 39.1 mg/dL (ref >39.00)
LDL Cholesterol: 110 mg/dL — ABNORMAL HIGH (ref 0–99)
NonHDL: 148.97
Total CHOL/HDL Ratio: 5
Triglycerides: 196 mg/dL — ABNORMAL HIGH (ref 0.0–149.0)
VLDL: 39.2 mg/dL (ref 0.0–40.0)

## 2024-01-24 LAB — MICROALBUMIN / CREATININE URINE RATIO
Creatinine,U: 145.3 mg/dL
Microalb Creat Ratio: 1.1 mg/g (ref 0.0–30.0)
Microalb, Ur: 1.6 mg/dL (ref 0.0–1.9)

## 2024-01-24 LAB — PSA: PSA: 4.8 ng/mL — ABNORMAL HIGH (ref 0.10–4.00)

## 2024-01-24 LAB — VITAMIN B12: Vitamin B-12: 665 pg/mL (ref 211–911)

## 2024-01-24 LAB — HEMOGLOBIN A1C: Hgb A1c MFr Bld: 6.6 % — ABNORMAL HIGH (ref 4.6–6.5)

## 2024-01-28 ENCOUNTER — Encounter: Payer: BC Managed Care – PPO | Admitting: Family Medicine

## 2024-02-07 ENCOUNTER — Other Ambulatory Visit: Payer: Self-pay | Admitting: Urology

## 2024-02-07 DIAGNOSIS — R972 Elevated prostate specific antigen [PSA]: Secondary | ICD-10-CM

## 2024-02-10 ENCOUNTER — Other Ambulatory Visit: Payer: Self-pay | Admitting: Family Medicine

## 2024-02-10 DIAGNOSIS — L2089 Other atopic dermatitis: Secondary | ICD-10-CM

## 2024-02-18 ENCOUNTER — Encounter: Payer: Self-pay | Admitting: Dermatology

## 2024-02-18 ENCOUNTER — Ambulatory Visit: Payer: BC Managed Care – PPO | Admitting: Dermatology

## 2024-02-18 VITALS — BP 148/87

## 2024-02-18 DIAGNOSIS — D492 Neoplasm of unspecified behavior of bone, soft tissue, and skin: Secondary | ICD-10-CM | POA: Diagnosis not present

## 2024-02-18 NOTE — Patient Instructions (Signed)
 Please [phone] to schedule an appointment for an ultrasound.    Important Information  Due to recent changes in healthcare laws, you may see results of your pathology and/or laboratory studies on MyChart before the doctors have had a chance to review them. We understand that in some cases there may be results that are confusing or concerning to you. Please understand that not all results are received at the same time and often the doctors may need to interpret multiple results in order to provide you with the best plan of care or course of treatment. Therefore, we ask that you please give [place] 2 business days to thoroughly review all your results before contacting the office for clarification. Should we see a critical lab result, you will be contacted sooner.   If You Need Anything After Your Visit  If you have any questions or concerns for your doctor, please call our main line at [phone] If no one answers, please leave a voicemail as directed and we will return your call as soon as possible. Messages left after 4 pm will be answered the following business day.   You may also send [place] a message via MyChart. We typically respond to MyChart messages within 1-2 business days.  For prescription refills, please ask your pharmacy to contact our office. Our fax number is [phone].  If you have an urgent issue when the clinic is closed that cannot wait until the next business day, you can page your doctor at the number below.    Please note that while we do our best to be available for urgent issues outside of office hours, we are not available 24/7.   If you have an urgent issue and are unable to reach [place], you may choose to seek medical care at your doctor's office, retail clinic, urgent care center, or emergency room.  If you have a medical emergency, please immediately call 911 or go to the emergency department. In the event of inclement weather, please call our main line at  [phone] for an update on the status of any delays or closures.  Dermatology Medication Tips: Please keep the boxes that topical medications come in in order to help keep track of the instructions about where and how to use these. Pharmacies typically print the medication instructions only on the boxes and not directly on the medication tubes.   If your medication is too expensive, please contact our office at [phone] or send [place] a message through MyChart.   We are unable to tell what your co-pay for medications will be in advance as this is different depending on your insurance coverage. However, we may be able to find a substitute medication at lower cost or fill out paperwork to get insurance to cover a needed medication.   If a prior authorization is required to get your medication covered by your insurance company, please allow [place] 1-2 business days to complete this process.  Drug prices often vary depending on where the prescription is filled and some pharmacies may offer cheaper prices.  The website www.goodrx.com contains coupons for medications through different pharmacies. The prices here do not account for what the cost may be with help from insurance (it may be cheaper with your insurance), but the website can give you the price if you did not use any insurance.  - You can print the associated coupon and take it with your prescription to the pharmacy.  - You may also stop by our office during regular  business hours and pick up a GoodRx coupon card.  - If you need your prescription sent electronically to a different pharmacy, notify our office through [facility] or by phone at [phone]

## 2024-02-18 NOTE — Progress Notes (Signed)
   New Patient Visit   Subjective  [name] [name] is a 53 y.o. male who presents for the following: Bump on post scalp x ~ 6 months with no changes. Not drained, not previously treated.  The following portions of the chart were reviewed this encounter and updated as appropriate: medications, allergies, medical history  Review of Systems:  No other skin or systemic complaints except as noted in HPI or Assessment and Plan.  Objective  Well appearing patient in no apparent distress; mood and affect are within normal limits.  A focused examination was performed of the following areas: Neck, scalp  Relevant exam findings are noted in the Assessment and Plan.       Assessment & Plan   EPIDERMAL INCLUSION CYST VS INFLAMED LYMPH NODE VS LESS LIKELY LIPOMA Exam: Subcutaneous nodule at right post neck/occipital scalp  Benign-appearing. Discussed excision. Prior to excision, he would need to have an ultrasound to R/O Inflamed lymph node. Pending [place] results, we will schedule for surgery. - US Soft Tissue Head/Neck (NON-THYROID); Future NEOPLASM OF SKIN   Related Procedures US Soft Tissue Head/Neck (NON-THYROID)  Return for Pending ultrasound.  I, Joanie Coddington, CMA, am acting as scribe for Gwenith Daily, MD .    Documentation: I have reviewed the above documentation for accuracy and completeness, and I agree with the above.  Gwenith Daily, MD

## 2024-02-21 ENCOUNTER — Ambulatory Visit
Admission: RE | Admit: 2024-02-21 | Discharge: 2024-02-21 | Disposition: A | Discharge status: Home / Self Care | Payer: BC Managed Care – PPO | Source: Ambulatory Visit | Attending: Dermatology | Admitting: Dermatology

## 2024-02-21 DIAGNOSIS — D492 Neoplasm of unspecified behavior of bone, soft tissue, and skin: Secondary | ICD-10-CM | POA: Diagnosis present

## 2024-02-26 ENCOUNTER — Telehealth: Payer: Self-pay | Admitting: *Deleted

## 2024-02-26 NOTE — Telephone Encounter (Signed)
 Attempt to reach pt for pre-visit. LM with call back #.  Will attempt to reach again in 5 min due to no other # listed in profile  Second attempt to reach pt for pre-vist unsuccessful. LM with facility # for pt to call back. Instructed pt to call # given by end of the day and reschedule the pre-visit  with RN or the scheduled procedure will be canceled.

## 2024-02-27 ENCOUNTER — Other Ambulatory Visit: Payer: Self-pay

## 2024-02-27 ENCOUNTER — Ambulatory Visit

## 2024-02-27 VITALS — Ht 69.0 in | Wt 215.0 lb

## 2024-02-27 DIAGNOSIS — Z1211 Encounter for screening for malignant neoplasm of colon: Secondary | ICD-10-CM

## 2024-02-27 MED ORDER — SUFLAVE 178.7 G PO SOLR: 1.0000 | ORAL | 0 refills | Status: DC

## 2024-02-27 NOTE — Progress Notes (Signed)
 Denies allergies to eggs or soy products. Denies complication of anesthesia or sedation. Denies use of weight loss medication. Denies use of O2.   Emmi instructions given for colonoscopy.

## 2024-03-02 ENCOUNTER — Ambulatory Visit
Admission: RE | Admit: 2024-03-02 | Discharge: 2024-03-02 | Disposition: A | Discharge status: Home / Self Care | Source: Ambulatory Visit | Attending: Emergency Medicine | Admitting: Emergency Medicine

## 2024-03-02 DIAGNOSIS — L0231 Cutaneous abscess of buttock: Secondary | ICD-10-CM

## 2024-03-02 MED ORDER — DOXYCYCLINE HYCLATE 100 MG PO CAPS
100.0000 mg | ORAL_CAPSULE | Freq: Two times a day (BID) | ORAL | 0 refills | Status: DC
Start: 2024-03-02 — End: 2024-06-16

## 2024-03-02 NOTE — ED Triage Notes (Signed)
 Provider evaluation and discharge occurred prior to this RN evaluation

## 2024-03-02 NOTE — Discharge Instructions (Signed)
 Attempted on incision and drainage today in clinic  Take doxycycline every morning and every evening for 7 days to clear bacteria.  Hold warm-hot compresses to affected area at least 4 times a day, this helps to facilitate draining, the more the better  Please return for evaluation for increased swelling, increased tenderness or pain, non healing site, non draining site, you begin to have fever or chills   We reviewed the etiology of recurrent abscesses of skin.  Skin abscesses are collections of pus within the dermis and deeper skin tissues. Skin abscesses manifest as painful, tender, fluctuant, and erythematous nodules, frequently surmounted by a pustule and surrounded by a rim of erythematous swelling.  Spontaneous drainage of purulent material may occur.  Fever can occur on occasion.    -Skin abscesses can develop in healthy individuals with no predisposing conditions other than skin or nasal carriage of Staphylococcus aureus.  Individuals in close contact with others who have active infection with skin abscesses are at increased risk which is likely to explain why twin brother has similar episodes.   In addition, any process leading to a breach in the skin barrier can also predispose to the development of a skin abscesses, such as atopic dermatitis.

## 2024-03-02 NOTE — ED Provider Notes (Signed)
 [name] [name]    CSN: [id number] Arrival date & time: 03/02/24  1636      History   Chief Complaint Chief Complaint  Patient presents with   Blister    I have a painful boil i would like lanced on my rear end - Entered by patient    HPI [name] [name] is a 53 y.o. male.   Patient presents for evaluation of abscess to the buttocks present for 4 days.  Has become erythematous, tender and painful.  Has occurred in the past in the same location.  Has attempted use of believes topical cream which was ineffective.  Denies fever or drainage.  Past Medical History:  Diagnosis Date   Allergy    Diabetes mellitus without complication (HCC)    Herpes    Hyperlipidemia    Hypertension     Patient Active Problem List   Diagnosis Date Noted   Erectile dysfunction 01/08/2020   Low back pain without sciatica 10/24/2018   Atopic dermatitis 10/15/2017   Benign paroxysmal positional vertigo 11/03/2016   Type 2 diabetes mellitus without complications (HCC) 09/16/2015   Hyperlipidemia associated with type 2 diabetes mellitus (HCC) 09/16/2015   Hypertension 09/09/2014   Genital herpes 09/09/2014    Past Surgical History:  Procedure Laterality Date   none         Home Medications    Prior to Admission medications   Medication Sig Start Date End Date Taking? Authorizing Provider  doxycycline (VIBRAMYCIN) 100 MG capsule Take 1 capsule (100 mg total) by mouth 2 (two) times daily. 03/02/24  Yes Dianara Smullen, Elita Boone, NP  amLODipine (NORVASC) 10 MG tablet TAKE ONE TABLET BY MOUTH ONE TIME DAILY 12/04/23   Shelva Majestic, MD  chlorthalidone (HYGROTON) 25 MG tablet Take 1 tablet (25 mg total) by mouth daily. 01/04/22   Shelva Majestic, MD  etodolac (LODINE) 300 MG capsule Take 1 capsule (300 mg total) by mouth every 8 (eight) hours as needed (for sparing back pain). 06/19/23   Shelva Majestic, MD  fluticasone Aleda Grana) 50 MCG/ACT nasal spray USE TWO SPRAYS IN [facility] NOSTRIL ONE  TIME DAILY 01/25/23   Shelva Majestic, MD  levofloxacin (LEVAQUIN) 750 MG tablet Take 750 mg by mouth daily.    [provider]  metFORMIN (GLUCOPHAGE) 500 MG tablet TAKE 1 TABLET BY MOUTH EVERY DAY WITH BREAKFAST 06/19/23   Shelva Majestic, MD  PEG 3350-KCl-NaCl-NaSulf-MgSul (SUFLAVE) 178.7 g SOLR Take 1 kit by mouth as directed. 02/27/24   Napoleon Form, MD  rosuvastatin (CRESTOR) 10 MG tablet TAKE ONE TABLET BY MOUTH EVERY WEEK 02/10/24   Shelva Majestic, MD  sildenafil (VIAGRA) 100 MG tablet TAKE ONE TABLET BY MOUTH ONE TIME DAILY AS NEEDED FOR ERECTILE DYSFUNCTION 01/25/23   Shelva Majestic, MD  triamcinolone cream (KENALOG) 0.1 % APPLY TOPICALLY TWO TIMES A DAY FOR ECZEMA FLARES FOR A MAXIMUM OF 10 DAYS 02/10/24   Shelva Majestic, MD  valACYclovir (VALTREX) 500 MG tablet TAKE 1 TABLET (500 MG TOTAL) BY MOUTH 2 (TWO) TIMES DAILY OR AS NEEDED FOR 3 DAYS DURING FLARE 06/19/23   Shelva Majestic, MD  vitamin B-12 (CYANOCOBALAMIN) 1000 MCG tablet Take 1,000 mcg by mouth 2 (two) times a week.    [provider]    Family History Family History  Problem Relation Age of Onset   Diabetes Mother    Kidney disease Brother        transplant   Colon  cancer Neg Hx    Esophageal cancer Neg Hx    Rectal cancer Neg Hx    Stomach cancer Neg Hx     Social History Social History   Tobacco Use   Smoking status: Never   Smokeless tobacco: Current   Tobacco comments:    Cigars  Substance Use Topics   Alcohol use: Yes    Alcohol/week: 1.0 standard drink of alcohol    Types: 1 Standard drinks or equivalent per week    Comment: max 3.    Drug use: No     Allergies   Lisinopril   Review of Systems Review of Systems   Physical Exam Triage Vital Signs ED Triage Vitals  Encounter Vitals Group     BP      Systolic BP Percentile      Diastolic BP Percentile      Pulse      Resp      Temp      Temp src      SpO2      Weight      Height      Head  Circumference      Peak Flow      Pain Score      Pain Loc      Pain Education      Exclude from Growth Chart    No data found.  Updated Vital Signs There were no vitals taken for this visit.  Visual Acuity Right Eye Distance:   Left Eye Distance:   Bilateral Distance:    Right Eye Near:   Left Eye Near:    Bilateral Near:     Physical Exam Constitutional:      Appearance: Normal appearance.  Eyes:     Extraocular Movements: Extraocular movements intact.  Pulmonary:     Effort: Pulmonary effort is normal.  Skin:    Comments: 1 x 2 erythematous and tender abscess present to the right buttocks  Neurological:     Mental Status: He is alert and oriented to person, place, and time. Mental status is at baseline.      UC Treatments / Results  Labs (all labs ordered are listed, but only abnormal results are displayed) Labs Reviewed - No data to display  EKG   Radiology No results found.  Procedures Incision and Drainage  Date/Time: 03/02/2024 5:20 PM  Performed by: Valinda Hoar, NP Authorized by: Valinda Hoar, NP   Consent:    Consent obtained:  Verbal   Consent given by:  Patient   Risks, benefits, and alternatives were discussed: yes     Risks discussed:  Incomplete drainage Universal protocol:    Procedure explained and questions answered to patient or proxy's satisfaction: yes     Patient identity confirmed:  Verbally with patient Location:    Type:  Abscess   Size:  1x2   Location: buttock right. Pre-procedure details:    Skin preparation:  Chlorhexidine with alcohol Sedation:    Sedation type:  None Anesthesia:    Anesthesia method:  Local infiltration   Local anesthetic:  Lidocaine 2% w/o epi Procedure type:    Complexity:  Simple Procedure details:    Incision types:  Single straight   Drainage:  Purulent   Wound treatment:  Wound left open   Packing materials:  None Post-procedure details:    Procedure completion:  Tolerated   (including critical care time)  Medications Ordered in UC Medications - No data to display  Initial Impression / Assessment and Plan / UC Course  I have reviewed the triage vital signs and the nursing notes.  Pertinent labs & imaging results that were available during my care of the patient were reviewed by me and considered in my medical decision making (see chart for details).  Abscess of buttock  I&D completed, some purulent drainage expelled, prescribed doxycycline and recommended warm compresses and over-the-counter analgesics for supportive care advised follow-up for nonhealing nondraining site Final Clinical Impressions(s) / UC Diagnoses   Final diagnoses:  Abscess of buttock     Discharge Instructions      Attempted on incision and drainage today in clinic  Take doxycycline every morning and every evening for 7 days to clear bacteria.  Hold warm-hot compresses to affected area at least 4 times a day, this helps to facilitate draining, the more the better  Please return for evaluation for increased swelling, increased tenderness or pain, non healing site, non draining site, you begin to have fever or chills   We reviewed the etiology of recurrent abscesses of skin.  Skin abscesses are collections of pus within the dermis and deeper skin tissues. Skin abscesses manifest as painful, tender, fluctuant, and erythematous nodules, frequently surmounted by a pustule and surrounded by a rim of erythematous swelling.  Spontaneous drainage of purulent material may occur.  Fever can occur on occasion.    -Skin abscesses can develop in healthy individuals with no predisposing conditions other than skin or nasal carriage of Staphylococcus aureus.  Individuals in close contact with others who have active infection with skin abscesses are at increased risk which is likely to explain why twin brother has similar episodes.   In addition, any process leading to a breach in the skin barrier can  also predispose to the development of a skin abscesses, such as atopic dermatitis.      ED Prescriptions     Medication Sig Dispense Auth. Provider   doxycycline (VIBRAMYCIN) 100 MG capsule Take 1 capsule (100 mg total) by mouth 2 (two) times daily. 14 capsule Enna Warwick, Elita Boone, NP      PDMP not reviewed this encounter.   Valinda Hoar, NP 03/02/24 1721

## 2024-03-05 ENCOUNTER — Ambulatory Visit
Admission: RE | Admit: 2024-03-05 | Discharge: 2024-03-05 | Disposition: A | Discharge status: Home / Self Care | Payer: BC Managed Care – PPO | Source: Ambulatory Visit | Attending: Urology | Admitting: Urology

## 2024-03-05 DIAGNOSIS — R972 Elevated prostate specific antigen [PSA]: Secondary | ICD-10-CM

## 2024-03-05 MED ORDER — GADOPICLENOL 0.5 MMOL/ML IV SOLN
10.0000 mL | Freq: Once | INTRAVENOUS | Status: AC | PRN
  Administered 2024-03-05: 10 mL via INTRAVENOUS

## 2024-03-17 ENCOUNTER — Encounter: Payer: Self-pay | Admitting: Gastroenterology

## 2024-03-20 ENCOUNTER — Encounter: Payer: Self-pay | Admitting: Gastroenterology

## 2024-03-20 ENCOUNTER — Ambulatory Visit (AMBULATORY_SURGERY_CENTER): Payer: BC Managed Care – PPO | Admitting: Gastroenterology

## 2024-03-20 VITALS — BP 125/75 | HR 69 | Temp 98.1°F | Resp 14 | Ht 69.0 in | Wt 215.0 lb

## 2024-03-20 DIAGNOSIS — D123 Benign neoplasm of transverse colon: Secondary | ICD-10-CM

## 2024-03-20 DIAGNOSIS — D122 Benign neoplasm of ascending colon: Secondary | ICD-10-CM | POA: Diagnosis not present

## 2024-03-20 DIAGNOSIS — K648 Other hemorrhoids: Secondary | ICD-10-CM | POA: Diagnosis not present

## 2024-03-20 DIAGNOSIS — Z1211 Encounter for screening for malignant neoplasm of colon: Secondary | ICD-10-CM

## 2024-03-20 DIAGNOSIS — K644 Residual hemorrhoidal skin tags: Secondary | ICD-10-CM

## 2024-03-20 DIAGNOSIS — K5730 Diverticulosis of large intestine without perforation or abscess without bleeding: Secondary | ICD-10-CM | POA: Diagnosis not present

## 2024-03-20 MED ORDER — SODIUM CHLORIDE 0.9 % IV SOLN: 500.0000 mL | Freq: Once | INTRAVENOUS | Status: DC

## 2024-03-20 NOTE — Progress Notes (Signed)
 A/o x 3, VSS, gd SR's, pleased with anesthesia, report to RN

## 2024-03-20 NOTE — Op Note (Signed)
 [facility] Patient Name: [name] [name] Procedure Date: 03/20/2024 2:55 PM MRN: [id number] Endoscopist: Napoleon Form , MD, [id number] Age: 53 Referring MD:  Date of Birth: 04/01/1971 Gender: Male Account #: [id number] Procedure:                Colonoscopy Indications:              Screening for colorectal malignant neoplasm Medicines:                Monitored Anesthesia Care Procedure:                Pre-Anesthesia Assessment:                           - Prior to the procedure, a History and Physical                            was performed, and patient medications and                            allergies were reviewed. The patient's tolerance of                            previous anesthesia was also reviewed. The risks                            and benefits of the procedure and the sedation                            options and risks were discussed with the patient.                            All questions were answered, and informed consent                            was obtained. Prior Anticoagulants: The patient has                            taken no anticoagulant or antiplatelet agents. ASA                            Grade Assessment: II - A patient with mild systemic                            disease. After reviewing the risks and benefits,                            the patient was deemed in satisfactory condition to                            undergo the procedure.                           After obtaining informed consent, the colonoscope  was passed under direct vision. Throughout the                            procedure, the patient's blood pressure, pulse, and                            oxygen saturations were monitored continuously. The                            Olympus Scope [id number] was introduced through the                            anus and advanced to the the cecum, identified by                             appendiceal orifice and ileocecal valve. The                            colonoscopy was performed without difficulty. The                            patient tolerated the procedure well. The quality                            of the bowel preparation was good. The ileocecal                            valve, appendiceal orifice, and rectum were                            photographed. Scope In: 3:05:48 PM Scope Out: 3:20:38 PM Scope Withdrawal Time: 0 hours 12 minutes 2 seconds  Total Procedure Duration: 0 hours 14 minutes 50 seconds  Findings:                 The perianal and digital rectal examinations were                            normal.                           A less than 1 mm polyp was found in the ascending                            colon. The polyp was sessile. The polyp was removed                            with a cold biopsy forceps. Resection and retrieval                            were complete.                           A 3 mm polyp was found in the transverse colon. The  polyp was sessile. The polyp was removed with a                            cold snare. Resection and retrieval were complete.                           Scattered small-mouthed diverticula were found in                            the sigmoid colon, descending colon and ascending                            colon.                           Non-bleeding external and internal hemorrhoids were                            found during retroflexion. The hemorrhoids were                            small. Complications:            No immediate complications. Estimated Blood Loss:     Estimated blood loss was minimal. Impression:               - One less than 1 mm polyp in the ascending colon,                            removed with a cold biopsy forceps. Resected and                            retrieved.                           - One 3 mm polyp in the transverse colon, removed                             with a cold snare. Resected and retrieved.                           - Diverticulosis in the sigmoid colon, in the                            descending colon and in the ascending colon.                           - Non-bleeding external and internal hemorrhoids. Recommendation:           - Patient has a contact number available for                            emergencies. The signs and symptoms of potential                            delayed complications were  discussed with the                            patient. Return to normal activities tomorrow.                            Written discharge instructions were provided to the                            patient.                           - Resume previous diet.                           - Continue present medications.                           - Await pathology results.                           - Repeat colonoscopy in 5-10 years for surveillance                            based on pathology results. Napoleon Form, MD 03/20/2024 3:26:22 PM This report has been signed electronically.

## 2024-03-20 NOTE — Progress Notes (Signed)
 Pt's states no medical or surgical changes since previsit or office visit.

## 2024-03-20 NOTE — Progress Notes (Signed)
 Called to room to assist during endoscopic procedure.  Patient ID and intended procedure confirmed with present staff. Received instructions for my participation in the procedure from the performing physician.

## 2024-03-20 NOTE — Progress Notes (Signed)
 [facility] History and Physical   Primary Care Physician:  Shelva Majestic, MD   Reason for Procedure:  Colorectal cancer screening  Plan:    Screening colonoscopy with possible interventions as needed     HPI: [name] [name] is a very pleasant 53 y.o. male here for screening colonoscopy. Denies any nausea, vomiting, abdominal pain, melena or bright red blood per rectum  The risks and benefits as well as alternatives of endoscopic procedure(s) have been discussed and reviewed. All questions answered. The patient agrees to proceed.    Past Medical History:  Diagnosis Date   Allergy    Diabetes mellitus without complication (HCC)    Herpes    Hyperlipidemia    Hypertension     Past Surgical History:  Procedure Laterality Date   none      Prior to Admission medications   Medication Sig Start Date End Date Taking? Authorizing Provider  amLODipine (NORVASC) 10 MG tablet TAKE ONE TABLET BY MOUTH ONE TIME DAILY 12/04/23  Yes Shelva Majestic, MD  chlorthalidone (HYGROTON) 25 MG tablet Take 1 tablet (25 mg total) by mouth daily. 01/04/22  Yes Shelva Majestic, MD  metFORMIN (GLUCOPHAGE) 500 MG tablet TAKE 1 TABLET BY MOUTH EVERY DAY WITH BREAKFAST 06/19/23  Yes Shelva Majestic, MD  PEG 3350-KCl-NaCl-NaSulf-MgSul (SUFLAVE) 178.7 g SOLR Take 1 kit by mouth as directed. 02/27/24  Yes Rosaleah Person, Eleonore Chiquito, MD  rosuvastatin (CRESTOR) 10 MG tablet TAKE ONE TABLET BY MOUTH EVERY WEEK 02/10/24  Yes Shelva Majestic, MD  vitamin B-12 (CYANOCOBALAMIN) 1000 MCG tablet Take 1,000 mcg by mouth 2 (two) times a week.   Yes [provider]  doxycycline (VIBRAMYCIN) 100 MG capsule Take 1 capsule (100 mg total) by mouth 2 (two) times daily. Patient not taking: Reported on 03/20/2024 03/02/24   Valinda Hoar, NP  etodolac (LODINE) 300 MG capsule Take 1 capsule (300 mg total) by mouth every 8 (eight) hours as needed (for sparing back pain). 06/19/23   Shelva Majestic, MD   fluticasone Aleda Grana) 50 MCG/ACT nasal spray USE TWO SPRAYS IN [facility] NOSTRIL ONE TIME DAILY 01/25/23   Shelva Majestic, MD  levofloxacin (LEVAQUIN) 750 MG tablet Take 750 mg by mouth daily.    [provider]  sildenafil (VIAGRA) 100 MG tablet TAKE ONE TABLET BY MOUTH ONE TIME DAILY AS NEEDED FOR ERECTILE DYSFUNCTION 01/25/23   Shelva Majestic, MD  triamcinolone cream (KENALOG) 0.1 % APPLY TOPICALLY TWO TIMES A DAY FOR ECZEMA FLARES FOR A MAXIMUM OF 10 DAYS 02/10/24   Shelva Majestic, MD  valACYclovir (VALTREX) 500 MG tablet TAKE 1 TABLET (500 MG TOTAL) BY MOUTH 2 (TWO) TIMES DAILY OR AS NEEDED FOR 3 DAYS DURING FLARE 06/19/23   Shelva Majestic, MD    Current Outpatient Medications  Medication Sig Dispense Refill   amLODipine (NORVASC) 10 MG tablet TAKE ONE TABLET BY MOUTH ONE TIME DAILY 90 tablet 3   chlorthalidone (HYGROTON) 25 MG tablet Take 1 tablet (25 mg total) by mouth daily. 90 tablet 3   metFORMIN (GLUCOPHAGE) 500 MG tablet TAKE 1 TABLET BY MOUTH EVERY DAY WITH BREAKFAST 90 tablet 3   PEG 3350-KCl-NaCl-NaSulf-MgSul (SUFLAVE) 178.7 g SOLR Take 1 kit by mouth as directed. 1 each 0   rosuvastatin (CRESTOR) 10 MG tablet TAKE ONE TABLET BY MOUTH EVERY WEEK 13 tablet 3   vitamin B-12 (CYANOCOBALAMIN) 1000 MCG tablet Take 1,000 mcg by mouth 2 (two) times a week.  doxycycline (VIBRAMYCIN) 100 MG capsule Take 1 capsule (100 mg total) by mouth 2 (two) times daily. (Patient not taking: Reported on 03/20/2024) 14 capsule 0   etodolac (LODINE) 300 MG capsule Take 1 capsule (300 mg total) by mouth every 8 (eight) hours as needed (for sparing back pain). 30 capsule 1   fluticasone (FLONASE) 50 MCG/ACT nasal spray USE TWO SPRAYS IN EACH NOSTRIL ONE TIME DAILY 48 mL 3   levofloxacin (LEVAQUIN) 750 MG tablet Take 750 mg by mouth daily.     sildenafil (VIAGRA) 100 MG tablet TAKE ONE TABLET BY MOUTH ONE TIME DAILY AS NEEDED FOR ERECTILE DYSFUNCTION 10 tablet 11   triamcinolone cream (KENALOG)  0.1 % APPLY TOPICALLY TWO TIMES A DAY FOR ECZEMA FLARES FOR A MAXIMUM OF 10 DAYS 80 g 2   valACYclovir (VALTREX) 500 MG tablet TAKE 1 TABLET (500 MG TOTAL) BY MOUTH 2 (TWO) TIMES DAILY OR AS NEEDED FOR 3 DAYS DURING FLARE 180 tablet 1   Current Facility-Administered Medications  Medication Dose Route Frequency Provider Last Rate Last Admin   0.9 %  sodium chloride infusion  500 mL Intravenous Once Napoleon Form, MD        Allergies as of 03/20/2024 - Review Complete 03/20/2024  Allergen Reaction Noted   Lisinopril Other (See Comments) 06/08/2015    Family History  Problem Relation Age of Onset   Diabetes Mother    Kidney disease Brother        transplant   Colon cancer Neg Hx    Esophageal cancer Neg Hx    Rectal cancer Neg Hx    Stomach cancer Neg Hx     Social History   Socioeconomic History   Marital status: Married    Spouse name: Not on file   Number of children: Not on file   Years of education: Not on file   Highest education level: Not on file  Occupational History   Not on file  Tobacco Use   Smoking status: Some Days    Types: Cigars   Smokeless tobacco: Never   Tobacco comments:    Cigars  Vaping Use   Vaping status: Never Used  Substance and Sexual Activity   Alcohol use: Yes    Alcohol/week: 1.0 standard drink of alcohol    Types: 1 Standard drinks or equivalent per week    Comment: max 3.    Drug use: No   Sexual activity: Yes  Other Topics Concern   Not on file  Social History Narrative   Married 2005 to [employer]. 1 son who is at [employer], [employer].       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    BA from [employer].       Hobbies: gamer-call of duty, [employer], nba 2   Social Drivers of Corporate investment banker Strain: Not on file  Food  Insecurity: Not on file  Transportation Needs: Not on file  Physical Activity: Not on file  Stress: Not on file  Social Connections: Not on file  Intimate Partner Violence: Not on file    Review of Systems:  All other review of systems negative except as mentioned in the HPI.  Physical Exam: Vital signs in last 24 hours: BP 130/75   Pulse 77  Temp 98.1 F (36.7 C) (Temporal)   Ht 5\' 9"  (1.753 m)   Wt 215 lb (97.5 kg)   SpO2 98%   BMI 31.75 kg/m  General:   Alert, NAD Lungs:  Clear .   Heart:  Regular rate and rhythm Abdomen:  Soft, nontender and nondistended. Neuro/Psych:  Alert and cooperative. Normal mood and affect. A and O x 3  Reviewed labs, radiology imaging, old records and pertinent past GI work up  Patient is appropriate for planned procedure(s) and anesthesia in an ambulatory setting   K. Scherry Ran , MD [phone]

## 2024-03-20 NOTE — Patient Instructions (Signed)
 YOU HAD AN ENDOSCOPIC PROCEDURE TODAY AT THE [facility]:   Refer to the procedure report that was given to you for any specific questions about what was found during the examination.  If the procedure report does not answer your questions, please call your gastroenterologist to clarify.  If you requested that your care partner not be given the details of your procedure findings, then the procedure report has been included in a sealed envelope for you to review at your convenience later.  YOU SHOULD EXPECT: Some feelings of bloating in the abdomen. Passage of more gas than usual.  Walking can help get rid of the air that was put into your GI tract during the procedure and reduce the bloating. If you had a lower endoscopy (such as a colonoscopy or flexible sigmoidoscopy) you may notice spotting of blood in your stool or on the toilet paper. If you underwent a bowel prep for your procedure, you may not have a normal bowel movement for a few days.  Please Note:  You might notice some irritation and congestion in your nose or some drainage.  This is from the oxygen used during your procedure.  There is no need for concern and it should clear up in a day or so.  SYMPTOMS TO REPORT IMMEDIATELY:  Following lower endoscopy (colonoscopy or flexible sigmoidoscopy):  Excessive amounts of blood in the stool  Significant tenderness or worsening of abdominal pains  Swelling of the abdomen that is new, acute  Fever of 100F or higher   For urgent or emergent issues, a gastroenterologist can be reached at any hour by calling [phone]. Do not use MyChart messaging for urgent concerns.    DIET:  We do recommend a small meal at first, but then you may proceed to your regular diet.  Drink plenty of fluids but you should avoid alcoholic beverages for 24 hours.  MEDICATIONS: Continue present medications.  FOLLOW UP: Await pathology results. Repeat colonoscopy in 5-10 years for surveillance based  on pathology results.  Please see handouts given to you by your recovery nurse: Polyps, Diverticulosis, Hemorrhoids.  Thank you for allowing [place] to provide for healthcare needs today.  ACTIVITY:  You should plan to take it easy for the rest of today and you should NOT DRIVE or use heavy machinery until tomorrow (because of the sedation medicines used during the test).    FOLLOW UP: Our staff will call the number listed on your records the next business day following your procedure.  We will call around 7:15- 8:00 am to check on you and address any questions or concerns that you may have regarding the information given to you following your procedure. If we do not reach you, we will leave a message.     If any biopsies were taken you will be contacted by phone or by letter within the next 1-3 weeks.  Please call us at [phone] if you have not heard about the biopsies in 3 weeks.    SIGNATURES/CONFIDENTIALITY: You and/or your care partner have signed paperwork which will be entered into your electronic medical record.  These signatures attest to the fact that that the information above on your After Visit Summary has been reviewed and is understood.  Full responsibility of the confidentiality of this discharge information lies with you and/or your care-partner.

## 2024-03-23 ENCOUNTER — Telehealth: Payer: Self-pay

## 2024-03-23 NOTE — Telephone Encounter (Signed)
  Follow up Call-     03/20/2024    2:15 PM  Call back number  Post procedure Call Back phone  # [phone]  Permission to leave phone message Yes    Attempted to call patient regarding follow-up. No answer, VM left.

## 2024-03-24 ENCOUNTER — Encounter: Payer: Self-pay | Admitting: Family Medicine

## 2024-03-25 LAB — SURGICAL PATHOLOGY

## 2024-04-09 ENCOUNTER — Encounter: Payer: Self-pay | Admitting: Dermatology

## 2024-04-14 ENCOUNTER — Ambulatory Visit: Admitting: Dermatology

## 2024-04-14 ENCOUNTER — Encounter: Payer: Self-pay | Admitting: Dermatology

## 2024-04-14 VITALS — BP 152/94 | HR 70 | Temp 98.4°F

## 2024-04-14 DIAGNOSIS — D485 Neoplasm of uncertain behavior of skin: Secondary | ICD-10-CM

## 2024-04-14 DIAGNOSIS — L720 Epidermal cyst: Secondary | ICD-10-CM | POA: Diagnosis not present

## 2024-04-14 DIAGNOSIS — D492 Neoplasm of unspecified behavior of bone, soft tissue, and skin: Secondary | ICD-10-CM

## 2024-04-14 NOTE — Progress Notes (Signed)
 Follow-Up Visit   Subjective  [name] [name] is a 53 y.o. male who presents for the following: Excision of a subcutaneous nodule on the right posterior neck, present for years. Patient had imaging that showed likely soft tissue cyst like lesion  The following portions of the chart were reviewed this encounter and updated as appropriate: medications, allergies, medical history  Review of Systems:  No other skin or systemic complaints except as noted in HPI or Assessment and Plan.  Objective  Well appearing patient in no apparent distress; mood and affect are within normal limits.  A focused examination was performed of the following areas: Right posterior neck Relevant physical exam findings are noted in the Assessment and Plan.     Assessment & Plan   NEOPLASM OF UNCERTAIN BEHAVIOR OF SKIN right posterior neck Skin excision  Excision method:  elliptical Lesion length (cm):  2.4 Lesion width (cm):  1.8 Margin per side (cm):  0.1 Total excision diameter (cm):  2.6 Informed consent: discussed and consent obtained   Timeout: patient name, date of birth, surgical site, and procedure verified   Procedure prep:  Patient was prepped and draped in usual sterile fashion Prep type:  Chlorhexidine Anesthesia: the lesion was anesthetized in a standard fashion   Anesthetic:  1% lidocaine w/ epinephrine 1-100,000 buffered w/ 8.4% NaHCO3 Instrument used: #15 blade   Hemostasis achieved with: suture, pressure and electrodesiccation   Outcome: patient tolerated procedure well with no complications   Post-procedure details: sterile dressing applied and wound care instructions given   Dressing type: petrolatum, bandage and pressure dressing    Skin repair Complexity:  Complex Final length (cm):  3 Informed consent: discussed and consent obtained   Timeout: patient name, date of birth, surgical site, and procedure verified   Procedure prep:  Patient was prepped and draped in usual sterile  fashion Prep type:  Chlorhexidine Anesthesia: the lesion was anesthetized in a standard fashion   Anesthetic:  1% lidocaine w/ epinephrine 1-100,000 buffered w/ 8.4% NaHCO3 Reason for type of repair: reduce tension to allow closure and preserve normal anatomy   Undermining: area extensively undermined   Subcutaneous layers (deep stitches):  Suture size:  4-0 Suture type: Vicryl (polyglactin 910)   Stitches:  Buried vertical mattress Fine/surface layer approximation (top stitches):  Suture size:  5-0 Suture type: fast-absorbing plain gut   Stitches: simple running   Suture removal (days):  0 Hemostasis achieved with: suture, pressure and electrodesiccation Outcome: patient tolerated procedure well with no complications   Post-procedure details: sterile dressing applied and wound care instructions given   Dressing type: bandage, petrolatum and pressure dressing   Specimen 1 - Surgical pathology Differential Diagnosis: R/O cyst vs lipoma vs other  Check Margins: No  The surgical wound was then cleaned, prepped, and re-anesthetized as above. Wound edges were undermined extensively along at least one entire edge and at a distance equal to or greater than the width of the defect (see wound defect size above) in order to achieve closure and decrease wound tension and anatomic distortion. Redundant tissue repair including standing cone removal was performed. Hemostasis was achieved with electrocautery. Subcutaneous and epidermal tissues were approximated with the above sutures. The surgical site was then lightly scrubbed with sterile, saline-soaked gauze. The area was then bandaged using Vaseline ointment, non-adherent gauze, gauze pads, and tape to provide an adequate pressure dressing. The patient tolerated the procedure well, was given detailed written and verbal wound care instructions, and was discharged in good  condition.   The patient will follow-up: PRN.  Return if symptoms worsen or fail  to improve.  I, Haig Levan, Surg Tech III, am acting as scribe for Deneise Finlay, MD.   Documentation: I have reviewed the above documentation for accuracy and completeness, and I agree with the above.  Deneise Finlay, MD

## 2024-04-14 NOTE — Patient Instructions (Signed)

## 2024-04-20 LAB — SURGICAL PATHOLOGY

## 2024-05-05 ENCOUNTER — Ambulatory Visit: Payer: Self-pay | Admitting: Gastroenterology

## 2024-06-01 ENCOUNTER — Encounter: Payer: Self-pay | Admitting: Family Medicine

## 2024-06-01 NOTE — Telephone Encounter (Signed)
 I spoke with patient by phone and answered his questions.  He is aware we will support him through this process

## 2024-06-02 NOTE — Telephone Encounter (Signed)
See report below.

## 2024-06-08 ENCOUNTER — Other Ambulatory Visit (HOSPITAL_COMMUNITY): Payer: Self-pay | Admitting: Urology

## 2024-06-08 DIAGNOSIS — C61 Malignant neoplasm of prostate: Secondary | ICD-10-CM

## 2024-06-09 ENCOUNTER — Telehealth: Payer: Self-pay | Admitting: Radiation Oncology

## 2024-06-09 NOTE — Telephone Encounter (Signed)
LVM to schedule CON with Dr. Manning.  

## 2024-06-16 ENCOUNTER — Encounter: Payer: Self-pay | Admitting: Radiation Oncology

## 2024-06-16 NOTE — Progress Notes (Signed)
 GU Location of Tumor / Histology: Prostate Ca  If Prostate Cancer, Gleason Score is (4 + 4) and PSA is (4.80 on 01/23/2024)  PSA 3.26 on 10/10/2022 PSA 3.59 on  07/25/2022 PSA 3.17 on  01/04/2022  Gilmore Sean presented as referral from Dr. Sherwood BIRCH. Car[facility]e [facility]) elevated PSA.  Biopsies     06/08/2024 Dr. Sherwood D. Bell III NM PET (PSMA) Skull to Mid Thigh CLINICAL DATA:      03/05/2024 Dr. Sherwood D. Bell III MR Prostate with/without Contrast CLINICAL DATA:  Elevated PSA level. R97.20   IMPRESSION: 1. No focal lesion of intermediate or higher suspicion for prostate cancer is identified. 2. Benign prostatic hypertrophy. 3. Suspected right paracentral/lateral recess disc protrusion at L5-S1.    Past/Anticipated interventions by urology, if any:  Dr. Sherwood BIRCH. Bell III   Past/Anticipated interventions by medical oncology, if any: NA  Weight changes, if any: No  IPSS:   13 SHIM:  19  Bowel/Bladder complaints, if any:  No  Nausea/Vomiting, if any: No  Pain issues, if any:  0/10  SAFETY ISSUES: Prior radiation?  No Pacemaker/ICD? No Possible current pregnancy? Male Is the patient on methotrexate? No  Current Complaints / other details:

## 2024-06-17 ENCOUNTER — Encounter (HOSPITAL_COMMUNITY)
Admission: RE | Admit: 2024-06-17 | Discharge: 2024-06-17 | Disposition: A | Discharge status: Home / Self Care | Source: Ambulatory Visit | Attending: Urology | Admitting: Urology

## 2024-06-17 DIAGNOSIS — C61 Malignant neoplasm of prostate: Secondary | ICD-10-CM | POA: Diagnosis present

## 2024-06-17 MED ORDER — FLOTUFOLASTAT F 18 GALLIUM 296-5846 MBQ/ML IV SOLN
8.2300 | Freq: Once | INTRAVENOUS | Status: DC
  Filled 2024-06-17: qty 9

## 2024-06-28 NOTE — Progress Notes (Signed)
 Radiation Oncology         [phone] ________________________________  Initial Outpatient Consultation  Name: [name] [name] MRN: [id number]  Date: 06/29/2024  DOB: 29-May-1971  RR:Ylwuzm, Garnette KIDD, MD  Carolee Sherwood JONETTA DOUGLAS, MD   REFERRING PHYSICIAN: Carolee Sherwood JONETTA DOUGLAS, MD  DIAGNOSIS: 53 y.o. gentleman with Stage T1c adenocarcinoma of the prostate with Gleason score of 4+3, and PSA of 4.8.  No diagnosis found.  HISTORY OF PRESENT ILLNESS: [name] [name] is a 53 y.o. male with a diagnosis of prostate cancer. He was noted to have an elevated PSA of 4.8 by his primary care physician, Dr. Katrinka.  Accordingly, he was referred for evaluation in urology by Dr. Carolee on 02/04/24,  digital rectal examination performed at that time showed no nodules or induration. He underwent a prostate MRI on 03/05/24, which showed no focal lesions. The patient proceeded to transrectal ultrasound with 12 biopsies of the prostate on 05/28/24.  The prostate volume measured 23.66 cc.  Out of 12 core biopsies, 5 were positive.  The maximum Gleason score was 4+3, and this was seen in right mid. Additionally, small foci of Gleason 3+4 were seen in left mid lateral and right mid lateral, and small foci of Gleason 3+3 in left mid and right base lateral.  He underwent staging PSMA PET scan on 06/17/24 showing no tracer-avid primary or evidence of metastatic disease.  The patient reviewed the biopsy results with his urologist and he has kindly been referred today for discussion of potential radiation treatment options.   PREVIOUS RADIATION THERAPY: No  PAST MEDICAL HISTORY:  Past Medical History:  Diagnosis Date   Allergy    Diabetes mellitus without complication (HCC)    Elevated PSA    Herpes    Hyperlipidemia    Hypertension       PAST SURGICAL HISTORY: Past Surgical History:  Procedure Laterality Date   PROSTATE BIOPSY      FAMILY HISTORY:  Family History  Problem Relation Age of Onset   Diabetes  Mother    Kidney disease Brother        transplant   Colon cancer Neg Hx    Esophageal cancer Neg Hx    Rectal cancer Neg Hx    Stomach cancer Neg Hx     SOCIAL HISTORY:  Social History   Socioeconomic History   Marital status: Married    Spouse name: Not on file   Number of children: Not on file   Years of education: Not on file   Highest education level: Not on file  Occupational History   Not on file  Tobacco Use   Smoking status: Some Days    Types: Cigars   Smokeless tobacco: Never   Tobacco comments:    Cigars  Vaping Use   Vaping status: Never Used  Substance and Sexual Activity   Alcohol use: Yes    Alcohol/week: 1.0 standard drink of alcohol    Types: 1 Standard drinks or equivalent per week    Comment: max 3.    Drug use: No   Sexual activity: Yes  Other Topics Concern   Not on file  Social History Narrative   Married 2005 to Jacqueline Nolley. 1 son who is at [employer], [employer].       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    BA from [employer].       Hobbies: gamer-call of duty, [employer], nba 2   Social Drivers of Corporate investment banker Strain: Not on file  Food Insecurity: Not on file  Transportation Needs: Not on file  Physical Activity: Not on file  Stress: Not on file  Social Connections: Not on file  Intimate Partner Violence: Not on file    ALLERGIES: Lisinopril   MEDICATIONS:  Current Outpatient Medications  Medication Sig Dispense Refill   amLODipine  (NORVASC ) 10 MG tablet TAKE ONE TABLET BY MOUTH ONE TIME DAILY 90 tablet 3   chlorthalidone  (HYGROTON ) 25 MG tablet Take 1 tablet (25 mg total) by mouth daily. 90 tablet 3   etodolac  (LODINE ) 300 MG capsule Take 1 capsule (300 mg total) by mouth every 8 (eight) hours as needed (for sparing back pain). 30  capsule 1   fluticasone  (FLONASE ) 50 MCG/ACT nasal spray USE TWO SPRAYS IN EACH NOSTRIL ONE TIME DAILY 48 mL 3   metFORMIN  (GLUCOPHAGE ) 500 MG tablet TAKE 1 TABLET BY MOUTH EVERY DAY WITH BREAKFAST 90 tablet 3   rosuvastatin  (CRESTOR ) 10 MG tablet TAKE ONE TABLET BY MOUTH EVERY WEEK 13 tablet 3   sildenafil  (VIAGRA ) 100 MG tablet TAKE ONE TABLET BY MOUTH ONE TIME DAILY AS NEEDED FOR ERECTILE DYSFUNCTION 10 tablet 11   triamcinolone  cream (KENALOG ) 0.1 % APPLY TOPICALLY TWO TIMES A DAY FOR ECZEMA FLARES FOR A MAXIMUM OF 10 DAYS 80 g 2   valACYclovir  (VALTREX ) 500 MG tablet TAKE 1 TABLET (500 MG TOTAL) BY MOUTH 2 (TWO) TIMES DAILY OR AS NEEDED FOR 3 DAYS DURING FLARE 180 tablet 1   vitamin B-12 (CYANOCOBALAMIN ) 1000 MCG tablet Take 1,000 mcg by mouth 2 (two) times a week.     Current Facility-Administered Medications  Medication Dose Route Frequency Provider Last Rate Last Admin   0.9 %  sodium chloride  infusion  500 mL Intravenous Once Nandigam, Kavitha V, MD        REVIEW OF SYSTEMS:  On review of systems, the patient reports that he is doing well overall. He denies any chest pain, shortness of breath, cough, fevers, chills, night sweats, unintended weight changes. He denies any bowel disturbances, and denies abdominal pain, nausea or vomiting. He denies any new musculoskeletal or joint aches or pains. His IPSS was ***, indicating *** urinary symptoms. His SHIM was ***, indicating he {does not have/has mild/moderate/severe} erectile dysfunction. A complete review of systems is obtained and is otherwise negative.    PHYSICAL EXAM:  Wt Readings from Last 3 Encounters:  03/20/24 215 lb (97.5 kg)  02/27/24 215 lb (97.5 kg)  01/23/24 221 lb 6.4 oz (100.4 kg)   Temp Readings from Last 3 Encounters:  04/14/24 98.4 F (36.9 C) (Temporal)  03/20/24 98.1 F (36.7 C) (Temporal)  01/23/24 98.4 F (36.9 C)   BP Readings from Last 3 Encounters:  04/14/24 (!) 152/94  03/20/24 125/75  02/18/24 (!)  148/87   Pulse Readings from Last 3 Encounters:  04/14/24 70  03/20/24 69  01/23/24 85    /10  In general this is a well appearing *** male in no acute distress. He's alert and oriented x4 and appropriate throughout the examination. Cardiopulmonary assessment is negative for acute distress, and he exhibits normal effort.     KPS = ***  100 - Normal; no complaints; no evidence of disease. 90   - Able to carry on normal activity;  minor signs or symptoms of disease. 80   - Normal activity with effort; some signs or symptoms of disease. 43   - Cares for self; unable to carry on normal activity or to do active work. 60   - Requires occasional assistance, but is able to care for most of his personal needs. 50   - Requires considerable assistance and frequent medical care. 40   - Disabled; requires special care and assistance. 30   - Severely disabled; hospital admission is indicated although death not imminent. 20   - Very sick; hospital admission necessary; active supportive treatment necessary. 10   - Moribund; fatal processes progressing rapidly. 0     - Dead  Karnofsky DA, Abelmann WH, Craver LS and Burchenal [facility] [phone]) The use of the nitrogen mustards in the palliative treatment of carcinoma: with particular reference to bronchogenic carcinoma Cancer 1 [phone]  LABORATORY DATA:  Lab Results  Component Value Date   WBC 6.1 01/23/2024   HGB 15.4 01/23/2024   HCT 43.8 01/23/2024   MCV 82.2 01/23/2024   PLT 321.0 01/23/2024   Lab Results  Component Value Date   NA 139 01/23/2024   K 3.8 01/23/2024   CL 102 01/23/2024   CO2 29 01/23/2024   Lab Results  Component Value Date   ALT 32 01/23/2024   AST 20 01/23/2024   ALKPHOS 78 01/23/2024   BILITOT 1.4 (H) 01/23/2024     RADIOGRAPHY: NM PET (PSMA) SKULL TO MID THIGH Result Date: 06/22/2024 CLINICAL DATA:  Recently diagnosed with prostate cancer. Biopsy last month. EXAM: NUCLEAR MEDICINE PET SKULL BASE TO THIGH TECHNIQUE: 8.2  mCi Flotufolastat (Posluma ) was injected intravenously. Full-ring PET imaging was performed from the skull base to thigh after the radiotracer. CT data was obtained and used for attenuation correction and anatomic localization. COMPARISON:  Prostate MR of 03/05/2024. FINDINGS: NECK No radiotracer activity in neck lymph nodes. Incidental CT finding: No cervical adenopathy. CHEST No radiotracer accumulation within mediastinal or hilar lymph nodes. No suspicious pulmonary nodules on the CT scan. Incidental CT finding: No thoracic adenopathy.  Clear lungs. ABDOMEN/PELVIS Prostate: No focal prostatic tracer uptake. Lymph nodes: No abnormal radiotracer accumulation within pelvic or abdominal nodes. Liver: No evidence of liver metastasis. Incidental CT finding: Marked hepatic steatosis. Abdominal aortic atherosclerosis. Normal adrenal glands. No abdominopelvic adenopathy. Mild prostatomegaly. SKELETON No focal activity to suggest skeletal metastasis. IMPRESSION: No tracer avid primary or evidence of metastatic disease. Hepatic steatosis Aortic Atherosclerosis (ICD10-I70.0). Electronically Signed   By: Rockey Kilts M.D.   On: 06/22/2024 11:24      IMPRESSION/PLAN: 1. 52 y.o. gentleman with Stage T1c adenocarcinoma of the prostate with Gleason Score of 4+3, and PSA of 4.8. We discussed the patient's workup and outlined the nature of prostate cancer in this setting. The patient's T stage, Gleason's score, and PSA put him into the intermediate risk group. Accordingly, he is eligible for a variety of potential treatment options including brachytherapy, 5.5 weeks of external radiation, or prostatectomy. We discussed the available radiation techniques, and focused on the details and logistics of delivery. We discussed and outlined the risks, benefits, short and long-term effects associated with radiotherapy and compared and contrasted these with prostatectomy. We discussed the role of SpaceOAR gel in reducing the rectal  toxicity associated with radiotherapy. He appears to have a good understanding of his disease and our treatment recommendations which are of curative intent.  He was encouraged to ask questions that were answered to his stated satisfaction.  At the conclusion of our conversation, the patient is interested in moving forward with ***.  We personally spent *** minutes in this encounter including chart review, reviewing radiological studies, meeting face-to-face with the patient, entering orders and completing documentation.   ------------------------------------------------   Donnice Barge, MD [facility]  Radiation Oncology Direct Dial: [phone]  Fax: [phone] [email or web address]  Skype  LinkedIn   This document serves as a record of services personally performed by Donnice Barge, MD. It was created on his behalf by Izetta Neither, a trained medical scribe. The creation of this record is based on the scribe's personal observations and the provider's statements to them. This document has been checked and approved by the attending provider.

## 2024-06-29 ENCOUNTER — Encounter: Payer: Self-pay | Admitting: Radiation Oncology

## 2024-06-29 ENCOUNTER — Ambulatory Visit
Admission: RE | Admit: 2024-06-29 | Discharge: 2024-06-29 | Disposition: A | Discharge status: Home / Self Care | Source: Ambulatory Visit | Attending: Radiation Oncology | Admitting: Radiation Oncology

## 2024-06-29 VITALS — BP 139/94 | HR 70 | Temp 97.3°F | Resp 18 | Ht 69.0 in | Wt 217.0 lb

## 2024-06-29 DIAGNOSIS — E785 Hyperlipidemia, unspecified: Secondary | ICD-10-CM | POA: Insufficient documentation

## 2024-06-29 DIAGNOSIS — Z7984 Long term (current) use of oral hypoglycemic drugs: Secondary | ICD-10-CM | POA: Insufficient documentation

## 2024-06-29 DIAGNOSIS — E119 Type 2 diabetes mellitus without complications: Secondary | ICD-10-CM | POA: Insufficient documentation

## 2024-06-29 DIAGNOSIS — C61 Malignant neoplasm of prostate: Secondary | ICD-10-CM | POA: Diagnosis present

## 2024-06-29 DIAGNOSIS — Z79624 Long term (current) use of inhibitors of nucleotide synthesis: Secondary | ICD-10-CM | POA: Diagnosis not present

## 2024-06-29 DIAGNOSIS — F17210 Nicotine dependence, cigarettes, uncomplicated: Secondary | ICD-10-CM | POA: Diagnosis not present

## 2024-06-29 DIAGNOSIS — K760 Fatty (change of) liver, not elsewhere classified: Secondary | ICD-10-CM | POA: Diagnosis not present

## 2024-06-29 DIAGNOSIS — I10 Essential (primary) hypertension: Secondary | ICD-10-CM | POA: Diagnosis not present

## 2024-06-29 DIAGNOSIS — I700 Atherosclerosis of aorta: Secondary | ICD-10-CM | POA: Diagnosis not present

## 2024-06-29 DIAGNOSIS — Z79899 Other long term (current) drug therapy: Secondary | ICD-10-CM | POA: Insufficient documentation

## 2024-06-29 HISTORY — DX: Elevated prostate specific antigen (PSA): R97.20

## 2024-07-02 ENCOUNTER — Encounter: Payer: Self-pay | Admitting: Family Medicine

## 2024-07-02 ENCOUNTER — Ambulatory Visit: Payer: Self-pay | Admitting: Family Medicine

## 2024-07-02 ENCOUNTER — Ambulatory Visit: Admitting: Family Medicine

## 2024-07-02 VITALS — BP 120/70 | HR 75 | Temp 98.3°F | Ht 69.0 in | Wt 214.8 lb

## 2024-07-02 DIAGNOSIS — I700 Atherosclerosis of aorta: Secondary | ICD-10-CM

## 2024-07-02 DIAGNOSIS — Z0000 Encounter for general adult medical examination without abnormal findings: Secondary | ICD-10-CM | POA: Diagnosis not present

## 2024-07-02 DIAGNOSIS — E785 Hyperlipidemia, unspecified: Secondary | ICD-10-CM

## 2024-07-02 DIAGNOSIS — I10 Essential (primary) hypertension: Secondary | ICD-10-CM

## 2024-07-02 DIAGNOSIS — C61 Malignant neoplasm of prostate: Secondary | ICD-10-CM

## 2024-07-02 DIAGNOSIS — E119 Type 2 diabetes mellitus without complications: Secondary | ICD-10-CM | POA: Diagnosis not present

## 2024-07-02 DIAGNOSIS — Z7984 Long term (current) use of oral hypoglycemic drugs: Secondary | ICD-10-CM

## 2024-07-02 DIAGNOSIS — E1169 Type 2 diabetes mellitus with other specified complication: Secondary | ICD-10-CM

## 2024-07-02 LAB — COMPREHENSIVE METABOLIC PANEL WITH GFR
ALT: 36 U/L (ref 0–53)
AST: 27 U/L (ref 0–37)
Albumin: 4.9 g/dL (ref 3.5–5.2)
Alkaline Phosphatase: 83 U/L (ref 39–117)
BUN: 10 mg/dL (ref 6–23)
CO2: 32 meq/L (ref 19–32)
Calcium: 10 mg/dL (ref 8.4–10.5)
Chloride: 98 meq/L (ref 96–112)
Creatinine, Ser: 0.94 mg/dL (ref 0.40–1.50)
GFR: 94.12 mL/min (ref >60.00)
Glucose, Bld: 108 mg/dL — ABNORMAL HIGH (ref 70–99)
Potassium: 3.5 meq/L (ref 3.5–5.1)
Sodium: 138 meq/L (ref 135–145)
Total Bilirubin: 1.9 mg/dL — ABNORMAL HIGH (ref 0.2–1.2)
Total Protein: 7.7 g/dL (ref 6.0–8.3)

## 2024-07-02 LAB — CBC WITH DIFFERENTIAL/PLATELET
Basophils Absolute: 0 K/uL (ref 0.0–0.1)
Basophils Relative: 0.2 % (ref 0.0–3.0)
Eosinophils Absolute: 0.1 K/uL (ref 0.0–0.7)
Eosinophils Relative: 2 % (ref 0.0–5.0)
HCT: 45.3 % (ref 39.0–52.0)
Hemoglobin: 15.8 g/dL (ref 13.0–17.0)
Lymphocytes Relative: 46.9 % — ABNORMAL HIGH (ref 12.0–46.0)
Lymphs Abs: 2.2 K/uL (ref 0.7–4.0)
MCHC: 34.9 g/dL (ref 30.0–36.0)
MCV: 80.8 fl (ref 78.0–100.0)
Monocytes Absolute: 0.3 K/uL (ref 0.1–1.0)
Monocytes Relative: 7.1 % (ref 3.0–12.0)
Neutro Abs: 2 K/uL (ref 1.4–7.7)
Neutrophils Relative %: 43.8 % (ref 43.0–77.0)
Platelets: 338 K/uL (ref 150.0–400.0)
RBC: 5.61 Mil/uL (ref 4.22–5.81)
RDW: 13.2 % (ref 11.5–15.5)
WBC: 4.7 K/uL (ref 4.0–10.5)

## 2024-07-02 LAB — LIPID PANEL
Cholesterol: 199 mg/dL (ref 0–200)
HDL: 44.9 mg/dL (ref >39.00)
LDL Cholesterol: 128 mg/dL — ABNORMAL HIGH (ref 0–99)
NonHDL: 153.63
Total CHOL/HDL Ratio: 4
Triglycerides: 128 mg/dL (ref 0.0–149.0)
VLDL: 25.6 mg/dL (ref 0.0–40.0)

## 2024-07-02 LAB — PSA: PSA: 5.2 ng/mL — ABNORMAL HIGH (ref 0.10–4.00)

## 2024-07-02 LAB — HEMOGLOBIN A1C: Hgb A1c MFr Bld: 6.7 % — ABNORMAL HIGH (ref 4.6–6.5)

## 2024-07-02 LAB — MICROALBUMIN / CREATININE URINE RATIO
Creatinine,U: 71.8 mg/dL
Microalb Creat Ratio: UNDETERMINED mg/g (ref 0.0–30.0)
Microalb, Ur: <0.7 mg/dL

## 2024-07-02 MED ORDER — CHLORTHALIDONE 25 MG PO TABS: 25.0000 mg | ORAL_TABLET | Freq: Every day | ORAL | 3 refills | Status: AC

## 2024-07-02 MED ORDER — ROSUVASTATIN CALCIUM 10 MG PO TABS: 10.0000 mg | ORAL_TABLET | ORAL | 3 refills | Status: AC

## 2024-07-02 MED ORDER — AMLODIPINE BESYLATE 10 MG PO TABS: 10.0000 mg | ORAL_TABLET | Freq: Every day | ORAL | 3 refills | Status: AC

## 2024-07-02 NOTE — Addendum Note (Signed)
 Addended by: KATRINKA GARNETTE KIDD on: 07/02/2024 05:57 PM   Modules accepted: Level of Service

## 2024-07-02 NOTE — Patient Instructions (Addendum)
 Please stop by lab before you go If you have mychart- we will send your results within 3 business days of us  receiving them.  If you do not have mychart- we will call you about results within 5 business days of us  receiving them.  *please also note that you will see labs on mychart as soon as they post. I will later go in and write notes on them- will say notes from Dr. Katrinka   Schedule dentist follow up   I wish you the best in your decision making for next steps for prostate cancer- rooting for you whichever way you go!   Recommended follow up: Return in about 6 months (around 01/02/2025) for followup or sooner if needed.Schedule b4 you leave.

## 2024-07-02 NOTE — Progress Notes (Addendum)
 Phone: [phone]   Subjective:  Patient presents today for their annual physical. Chief complaint-noted.   See problem oriented charting- ROS- full  review of systems was completed and negative  Per full ROS sheet completed by patient except for topics noted under acute/chronic concerns  The following were reviewed and entered/updated in epic: Past Medical History:  Diagnosis Date   Allergy    Diabetes mellitus without complication (HCC)    Elevated PSA    Herpes    Hyperlipidemia    Hypertension    Patient Active Problem List   Diagnosis Date Noted   Malignant neoplasm of prostate (HCC) 06/29/2024    Priority: High   Aortic atherosclerosis (HCC) 07/02/2024    Priority: Medium    Low back pain without sciatica 10/24/2018    Priority: Medium    Atopic dermatitis 10/15/2017    Priority: Medium    Type 2 diabetes mellitus without complications (HCC) 09/16/2015    Priority: Medium    Hyperlipidemia associated with type 2 diabetes mellitus (HCC) 09/16/2015    Priority: Medium    Hypertension 09/09/2014    Priority: Medium    Erectile dysfunction 01/08/2020    Priority: Low   Benign paroxysmal positional vertigo 11/03/2016    Priority: Low   Genital herpes 09/09/2014    Priority: Low   Past Surgical History:  Procedure Laterality Date   PROSTATE BIOPSY      Family History  Problem Relation Age of Onset   Diabetes Mother    Kidney disease Brother        transplant   Colon cancer Neg Hx    Esophageal cancer Neg Hx    Rectal cancer Neg Hx    Stomach cancer Neg Hx     Medications- reviewed and updated Current Outpatient Medications  Medication Sig Dispense Refill   etodolac  (LODINE ) 300 MG capsule Take 1 capsule (300 mg total) by mouth every 8 (eight) hours as needed (for sparing back pain). 30 capsule 1   fluticasone  (FLONASE ) 50 MCG/ACT nasal spray USE TWO SPRAYS IN EACH NOSTRIL ONE TIME DAILY 48 mL 3   metFORMIN  (GLUCOPHAGE ) 500 MG tablet TAKE 1 TABLET BY  MOUTH EVERY DAY WITH BREAKFAST 90 tablet 3   sildenafil  (VIAGRA ) 100 MG tablet TAKE ONE TABLET BY MOUTH ONE TIME DAILY AS NEEDED FOR ERECTILE DYSFUNCTION 10 tablet 11   triamcinolone  cream (KENALOG ) 0.1 % APPLY TOPICALLY TWO TIMES A DAY FOR ECZEMA FLARES FOR A MAXIMUM OF 10 DAYS 80 g 2   valACYclovir  (VALTREX ) 500 MG tablet TAKE 1 TABLET (500 MG TOTAL) BY MOUTH 2 (TWO) TIMES DAILY OR AS NEEDED FOR 3 DAYS DURING FLARE 180 tablet 1   vitamin B-12 (CYANOCOBALAMIN ) 1000 MCG tablet Take 1,000 mcg by mouth 2 (two) times a week.     amLODipine  (NORVASC ) 10 MG tablet Take 1 tablet (10 mg total) by mouth daily. 90 tablet 3   chlorthalidone  (HYGROTON ) 25 MG tablet Take 1 tablet (25 mg total) by mouth daily. 90 tablet 3   rosuvastatin  (CRESTOR ) 10 MG tablet Take 1 tablet (10 mg total) by mouth once a week. 13 tablet 3   Current Facility-Administered Medications  Medication Dose Route Frequency Provider Last Rate Last Admin   0.9 %  sodium chloride  infusion  500 mL Intravenous Once Nandigam, Kavitha V, MD        Allergies-reviewed and updated Allergies  Allergen Reactions   Lisinopril  Other (See Comments)    Vertigo thought potentially caused by lisinopril - resolved  off med    Social History   Social History Narrative   Married 2005 to Jacqueline Devaul. 1 son who is at [employer], [employer].       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    BA from [employer].       Hobbies: gamer-call of duty, [employer], nba 2   Objective  Objective:  BP 120/70   Pulse 75   Temp 98.3 F (36.8 C)   Ht 5' 9 (1.753 m)   Wt 214 lb 12.8 oz (97.4 kg)   SpO2 97%   BMI 31.72 kg/m  Gen: NAD, resting comfortably HEENT: Mucous membranes are moist. Oropharynx normal Neck: no thyromegaly CV: RRR no murmurs rubs or gallops Lungs: CTAB no  crackles, wheeze, rhonchi Abdomen: soft/nontender/nondistended/normal bowel sounds. No rebound or guarding.  Ext: no edema Skin: warm, dry Neuro: grossly normal, moves all extremities, PERRLA  Diabetic foot exam was performed with the following findings:   No deformities, ulcerations, or other skin breakdown Normal sensation of 10g monofilament Intact posterior tibialis and dorsalis pedis pulses       Assessment and Plan  53 y.o. male presenting for annual physical.  Health Maintenance counseling: 1. Anticipatory guidance: Patient counseled regarding regular dental exams -advised q6 months, eye exams -yearly,  avoiding smoking and second hand smoke- other than cigars below mentioned , limiting alcohol to 2 beverages per day - 3 a week, no illicit drugs .   2. Risk factor reduction:  Advised patient of need for regular exercise and diet rich and fruits and vegetables to reduce risk of heart attack and stroke.  Exercise- back in gym and doing pickleball.  Diet/weight management-down 7 lbs with dietary changes and exercise. Reduce portions Wt Readings from Last 3 Encounters:  07/02/24 214 lb 12.8 oz (97.4 kg)  06/29/24 217 lb (98.4 kg)  03/20/24 215 lb (97.5 kg)  3. Immunizations/screenings/ancillary studies- has had hepatitis B vaccine in childhood, shingrix declines for now, Prevnar 20 opts out for now  Immunization History  Administered Date(s) Administered   Influenza,inj,Quad PF,6+ Mos 09/03/2019, 10/04/2020   PFIZER(Purple Top)SARS-COV-2 Vaccination 04/01/2020, 04/27/2020   Td 12/25/2007   Tdap 10/24/2018  4. Prostate cancer - see below- he does want to see PSA  Lab Results  Component Value Date   PSA 4.80 (H) 01/23/2024   PSA 3.26 10/10/2022   PSA 3.59 07/25/2022   5. Colon cancer screening - Colonoscopy May 2025 with 7-year repeat with Dr. Shila 6. Skin cancer screening- lower risk due to melanin content. advised regular sunscreen use. Denies worrisome, changing, or new  skin lesions.  7. Smoking associated screening (lung cancer screening, AAA screen 65-75, UA)- sparing cigarsmoker- trying to cut back to holidays from once a week- gets UA with urology 8. STD screening - only active with week  Status of chronic or acute concerns   # History of military service-patient with intermittent issues dating back to, military service-with at times rather significant Low back pain-sparing etodolac  300 mg- not needing much lately  -Patient may reach out for my support and getting established with the [place]  # Intermediate risk prostate cancer-working with [facility]-leaning toward robotic radical prostatectomy and bilateral pelvic lymphadenectomy-he is considering waiting until insurance change in January 2026 or maybe october  -PET scan  reassuring 06/17/2024  # Diabetes S: Medication:Metformin  500 mg daily- takes b12 while on this -interested in ozempic when more available  Lab Results  Component Value Date   HGBA1C 6.6 (H) 01/23/2024   HGBA1C 5.9 06/19/2023   HGBA1C 6.1 01/08/2023  A/P: hopefully stable- update a1c today. Continue current meds for now    #hypertension S: medication: Amlodipine  10 mg, chlorthalidone  25 mg A/P: well controlled continue current medications    #hyperlipidemia # Aortic atherosclerosis-incidental finding on PET scan S: Medication: rosuvastatin  10 mg weekly  -has improved diet and back in the gym Lab Results  Component Value Date   CHOL 188 01/23/2024   HDL 39.10 01/23/2024   LDLCALC 110 (H) 01/23/2024   LDLDIRECT 159.0 01/04/2022   TRIG 196.0 (H) 01/23/2024   CHOLHDL 5 01/23/2024   A/P: update lipids first but could consider twice a week or 3x a week to lower risk further and push for LDL under 70 particularly with new finding aortic atherosclerosis - thrilled he's improving diet and back in the gym though   #Erectile dysfunction- as needed Viagra  is helpful   #Genital Herpes- taking Valacyclovir  500 mg prn. No outbreaks  recently. Has med refill if needed.   #Eczema-Eucerin as needed lately- has been doing well    Recommended follow up: Return in about 6 months (around 01/02/2025) for followup or sooner if needed.Schedule b4 you leave. Future Appointments  Date Time Provider Department Center  07/16/2024  2:00 PM Katrinka Garnette KIDD, MD [facility]   Lab/Order associations: fasting   ICD-10-CM   1. Preventative health care  Z00.00     2. Aortic atherosclerosis (HCC)  I70.0     3. Hyperlipidemia associated with type 2 diabetes mellitus (HCC)  E11.69    E78.5     4. Primary hypertension  I10     5. Type 2 diabetes mellitus without complication, unspecified whether long term insulin use (HCC)  E11.9 Urine Microalbumin w/creat. ratio    Hemoglobin A1c    Comprehensive metabolic panel with GFR    CBC with Differential/Platelet    Lipid panel    6. Malignant neoplasm of prostate (HCC)  C61 PSA      Meds ordered this encounter  Medications   amLODipine  (NORVASC ) 10 MG tablet    Sig: Take 1 tablet (10 mg total) by mouth daily.    Dispense:  90 tablet    Refill:  3   chlorthalidone  (HYGROTON ) 25 MG tablet    Sig: Take 1 tablet (25 mg total) by mouth daily.    Dispense:  90 tablet    Refill:  3   rosuvastatin  (CRESTOR ) 10 MG tablet    Sig: Take 1 tablet (10 mg total) by mouth once a week.    Dispense:  13 tablet    Refill:  3    Return precautions advised.  Garnette Katrinka, MD

## 2024-07-06 NOTE — Progress Notes (Signed)
 Introduced myself to the patient as the prostate nurse navigator.  No barriers to care identified at this time.  He is here to discuss his radiation treatment options and remains undecided at this time.  I gave him my business card and asked him to call me with questions or concerns.  Verbalized understanding.

## 2024-07-09 NOTE — Progress Notes (Signed)
 Patient has confirmed he would like to proceed with surgery for his stage T1c adenocarcinoma of the prostate with Gleason score of 4+3, and PSA of 4.8.  MD notified.  No navigation needs at this time.

## 2024-07-10 ENCOUNTER — Encounter: Payer: BC Managed Care – PPO | Admitting: Family Medicine

## 2024-07-16 ENCOUNTER — Ambulatory Visit: Admitting: Family Medicine

## 2024-07-17 ENCOUNTER — Other Ambulatory Visit: Payer: Self-pay | Admitting: Urology

## 2024-09-10 ENCOUNTER — Telehealth: Payer: Self-pay | Admitting: Family Medicine

## 2024-09-10 NOTE — Telephone Encounter (Signed)
 Left vm for Christine regarding correct fax number for medical records requests.    Copied from CRM [id number]. Topic: General - Other >> Sep 09, 2024  2:55 PM Rosina BIRCH wrote: Reason for CRM: christine from [employer] called wanting to see if we received a medical records request  CB [phone] ext [phone]

## 2024-09-29 NOTE — Progress Notes (Signed)
 Anesthesia Review:  PCP: Garnette Lukes LVO 07/02/24  Cardiologist :none   PPM/ ICD: Device Orders: Rep Notified:  Chest x-ray : EKG :10/01/2024  Echo : Stress test: Cardiac Cath :   Activity level:  can do a flight of stairs without difficulty  Sleep Study/ CPAP : none  Fasting Blood Sugar :      / Checks Blood Sugar -- times a day:   DM- type 2- does not check glucose at home  Hgba1c- 10/01/24- 5.9 Metformin - none am of surgery   Blood Thinner/ Instructions /Last Dose: ASA / Instructions/ Last Dose :

## 2024-09-30 NOTE — Patient Instructions (Signed)
 SURGICAL WAITING ROOM VISITATION  Patients having surgery or a procedure may have no more than 2 support people in the waiting area - these visitors may rotate.    Children under the age of 73 must have an adult with them who is not the patient.  Visitors with respiratory illnesses are discouraged from visiting and should remain at home.  If the patient needs to stay at the hospital during part of their recovery, the visitor guidelines for inpatient rooms apply. Pre-op nurse will coordinate an appropriate time for 1 support person to accompany patient in pre-op.  This support person may not rotate.    Please refer to the [facility] website for the visitor guidelines for Inpatients (after your surgery is over and you are in a regular room).       Your procedure is scheduled on: 10/12/2024     Report to [facility] Main Entrance    Report to admitting at 0515 AM   Call this number if you have problems the morning of surgery [phone]   Clear liquid diet the day before surgery.            Magnesium Citrate- 8 ounces at 12 noon day before surgery.            Fleets enema night before surgery           Nothing after midnite.                              If you have questions, please contact your surgeon's office.   FOLLOW BOWEL PREP AND ANY ADDITIONAL PRE OP INSTRUCTIONS YOU RECEIVED FROM YOUR SURGEON'S OFFICE!!!     Oral Hygiene is also important to reduce your risk of infection.                                    Remember - BRUSH YOUR TEETH THE MORNING OF SURGERY WITH YOUR REGULAR TOOTHPASTE  DENTURES WILL BE REMOVED PRIOR TO SURGERY PLEASE DO NOT APPLY Poly grip OR ADHESIVES!!!   Do NOT smoke after Midnight   Stop all vitamins and herbal supplements 7 days before surgery.   Take these medicines the morning of surgery with A SIP OF WATER:  amlodipine , flonase    DO NOT TAKE ANY ORAL DIABETIC MEDICATIONS DAY OF YOUR SURGERY  Bring CPAP mask and tubing day  of surgery.                              You may not have any metal on your body including hair pins, jewelry, and body piercing             Do not wear make-up, lotions, powders, perfumes/cologne, or deodorant  Do not wear nail polish including gel and S&S, artificial/acrylic nails, or any other type of covering on natural nails including finger and toenails. If you have artificial nails, gel coating, etc. that needs to be removed by a nail salon please have this removed prior to surgery or surgery may need to be canceled/ delayed if the surgeon/ anesthesia feels like they are unable to be safely monitored.   Do not shave  48 hours prior to surgery.               Men may shave face and neck.  Do not bring valuables to the hospital. [facility] IS NOT             RESPONSIBLE   FOR VALUABLES.   Contacts, glasses, dentures or bridgework may not be worn into surgery.   Bring small overnight bag day of surgery.   DO NOT BRING YOUR HOME MEDICATIONS TO THE HOSPITAL. PHARMACY WILL DISPENSE MEDICATIONS LISTED ON YOUR MEDICATION LIST TO YOU DURING YOUR ADMISSION IN THE HOSPITAL!    Patients discharged on the day of surgery will not be allowed to drive home.  Someone NEEDS to stay with you for the first 24 hours after anesthesia.   Special Instructions: Bring a copy of your healthcare power of attorney and living will documents the day of surgery if you haven't scanned them before.              Please read over the following fact sheets you were given: IF YOU HAVE QUESTIONS ABOUT YOUR PRE-OP INSTRUCTIONS PLEASE CALL [phone].   If you received a COVID test during your pre-op visit  it is requested that you wear a mask when out in public, stay away from anyone that may not be feeling well and notify your surgeon if you develop symptoms. If you test positive for Covid or have been in contact with anyone that has tested positive in the last 10 days please notify you surgeon.    [place] -  Preparing for Surgery Before surgery, you can play an important role.  Because skin is not sterile, your skin needs to be as free of germs as possible.  You can reduce the number of germs on your skin by washing with CHG (chlorahexidine gluconate) soap before surgery.  CHG is an antiseptic cleaner which kills germs and bonds with the skin to continue killing germs even after washing. Please DO NOT use if you have an allergy to CHG or antibacterial soaps.  If your skin becomes reddened/irritated stop using the CHG and inform your nurse when you arrive at Short Stay. Do not shave (including legs and underarms) for at least 48 hours prior to the first CHG shower.  You may shave your face/neck.  Please follow these instructions carefully:  1.  Shower with CHG Soap the night before surgery ONLY (DO NOT USE THE SOAP THE MORNING OF SURGERY).  2.  If you choose to wash your hair, wash your hair first as usual with your normal  shampoo.  3.  After you shampoo, rinse your hair and body thoroughly to remove the shampoo.                             4.  Use CHG as you would any other liquid soap.  You can apply chg directly to the skin and wash.  Gently with a scrungie or clean washcloth.  5.  Apply the CHG Soap to your body ONLY FROM THE NECK DOWN.   Do   not use on face/ open                           Wound or open sores. Avoid contact with eyes, ears mouth and   genitals (private parts).                       Wash face,  Genitals (private parts) with your normal soap.  6.  Wash thoroughly, paying special attention to the area where your    surgery  will be performed.  7.  Thoroughly rinse your body with warm water from the neck down.  8.  DO NOT shower/wash with your normal soap after using and rinsing off the CHG Soap.                9.  Pat yourself dry with a clean towel.            10.  Wear clean pajamas.            11.  Place clean sheets on your bed the night of your first shower and do not   sleep with pets. Day of Surgery : Do not apply any CHG, lotions/deodorants the morning of surgery.  Please wear clean clothes to the hospital/surgery center.  FAILURE TO FOLLOW THESE INSTRUCTIONS MAY RESULT IN THE CANCELLATION OF YOUR SURGERY  PATIENT SIGNATURE_________________________________  NURSE SIGNATURE__________________________________  ________________________________________________________________________

## 2024-10-01 ENCOUNTER — Encounter (HOSPITAL_COMMUNITY): Payer: Self-pay

## 2024-10-01 ENCOUNTER — Other Ambulatory Visit: Payer: Self-pay

## 2024-10-01 ENCOUNTER — Encounter (HOSPITAL_COMMUNITY)
Admission: RE | Admit: 2024-10-01 | Discharge: 2024-10-01 | Disposition: A | Discharge status: Home / Self Care | Source: Ambulatory Visit | Attending: Urology | Admitting: Urology

## 2024-10-01 VITALS — BP 144/91 | HR 76 | Temp 98.1°F | Resp 16 | Ht 70.0 in | Wt 211.0 lb

## 2024-10-01 DIAGNOSIS — Z01818 Encounter for other preprocedural examination: Secondary | ICD-10-CM | POA: Insufficient documentation

## 2024-10-01 DIAGNOSIS — I440 Atrioventricular block, first degree: Secondary | ICD-10-CM | POA: Diagnosis not present

## 2024-10-01 HISTORY — DX: Malignant (primary) neoplasm, unspecified: C80.1

## 2024-10-01 LAB — TYPE AND SCREEN
ABO/RH(D): O NEG
Antibody Screen: NEGATIVE

## 2024-10-01 LAB — BASIC METABOLIC PANEL WITH GFR
Anion gap: 9 (ref 5–15)
BUN: 8 mg/dL (ref 6–20)
CO2: 29 mmol/L (ref 22–32)
Calcium: 9.6 mg/dL (ref 8.9–10.3)
Chloride: 102 mmol/L (ref 98–111)
Creatinine, Ser: 0.79 mg/dL (ref 0.61–1.24)
GFR, Estimated: >60 mL/min (ref >60)
Glucose, Bld: 128 mg/dL — ABNORMAL HIGH (ref 70–99)
Potassium: 4 mmol/L (ref 3.5–5.1)
Sodium: 140 mmol/L (ref 135–145)

## 2024-10-01 LAB — CBC
HCT: 45.5 % (ref 39.0–52.0)
Hemoglobin: 15.9 g/dL (ref 13.0–17.0)
MCH: 28.5 pg (ref 26.0–34.0)
MCHC: 34.9 g/dL (ref 30.0–36.0)
MCV: 81.7 fL (ref 80.0–100.0)
Platelets: 324 K/uL (ref 150–400)
RBC: 5.57 MIL/uL (ref 4.22–5.81)
RDW: 12.5 % (ref 11.5–15.5)
WBC: 5.2 K/uL (ref 4.0–10.5)
nRBC: 0 % (ref 0.0–0.2)

## 2024-10-01 LAB — HEMOGLOBIN A1C
Hgb A1c MFr Bld: 5.9 % — ABNORMAL HIGH (ref 4.8–5.6)
Mean Plasma Glucose: 122.63 mg/dL

## 2024-10-01 LAB — GLUCOSE, CAPILLARY: Glucose-Capillary: 130 mg/dL — ABNORMAL HIGH (ref 70–99)

## 2024-10-05 NOTE — Anesthesia Preprocedure Evaluation (Addendum)
 Anesthesia Evaluation  Patient identified by MRN, date of birth, ID band Patient awake    Reviewed: Allergy & Precautions, NPO status , Patient's Chart, lab work & pertinent test results  History of Anesthesia Complications Negative for: history of anesthetic complications  Airway Mallampati: II  TM Distance: >3 FB Neck ROM: Full    Dental no notable dental hx. (+) Teeth Intact, Dental Advisory Given   Pulmonary Current Smoker and Patient abstained from smoking.   Pulmonary exam normal breath sounds clear to auscultation       Cardiovascular hypertension, Pt. on medications (-) angina (-) Past MI Normal cardiovascular exam Rhythm:Regular Rate:Normal     Neuro/Psych negative neurological ROS  negative psych ROS   GI/Hepatic negative GI ROS, Neg liver ROS,,,  Endo/Other  diabetes, Type 2    Renal/GU Lab Results      Component                Value               Date                          K                        4.0                 10/01/2024                CO2                      29                  10/01/2024                   CREATININE               0.79                10/01/2024                    GLUCOSE                  128 (H)             10/01/2024                Musculoskeletal   Abdominal   Peds  Hematology Lab Results      Component                Value               Date                      WBC                      5.2                 10/01/2024                HGB                      15.9                10/01/2024                HCT  45.5                10/01/2024                MCV                      81.7                10/01/2024                PLT                      324                 10/01/2024              Anesthesia Other Findings All: lisinopril   Reproductive/Obstetrics                              Anesthesia Physical Anesthesia  Plan  ASA: 3  Anesthesia Plan: General   Post-op Pain Management: Ofirmev IV (intra-op)*, Precedex and Lidocaine infusion*   Induction: Intravenous  PONV Risk Score and Plan: Treatment may vary due to age or medical condition, Midazolam, Dexamethasone and Ondansetron   Airway Management Planned: Oral ETT  Additional Equipment: None  Intra-op Plan:   Post-operative Plan: Extubation in OR  Informed Consent: I have reviewed the patients History and Physical, chart, labs and discussed the procedure including the risks, benefits and alternatives for the proposed anesthesia with the patient or authorized representative who has indicated his/her understanding and acceptance.     Dental advisory given  Plan Discussed with: CRNA and Surgeon  Anesthesia Plan Comments:          Anesthesia Quick Evaluation

## 2024-10-11 NOTE — H&P (Signed)
 Visit Note - October 06, 2024 PMS ID: Sex: DOB: Phone: MRN: [name] [name] Male July 04, 1971 [id number] [name] [name] [name] [name] [name] [name]  CC/HPI: Pt presents for H/P prior to RALP with BPLND by Dr. Renda on 10/12/24. He is doing well and is without complaint. Pt denies F/C, HA, CP, SOB, N/V, diarrhea/constipation, hematuria, dysuria, and flank pain. Historical Summary: CC: Prostate Cancer Physician requesting consult: Dr. Oliva Edison PCP: Dr. Garnette Lukes [name] [name] is a 53 year old gentleman who was found to have an elevated PSA of 4.8. He underwent an MRI of the prostate on 03/05/24 that was unremarkable. He proceeded with a TRUS biopsy of the prostate on 05/28/24 that indicated a Gleason 4+3=7 adenocarcinoma with 5 out of 12 biopsy cores positive for malignancy.  Family history: None. Imaging studies: MRI (03/05/24) - No EPE, SVI, LAD, or bone lesions. PMH: He has a history of hypertension and diabetes. PSH: No abdominal surgeries. TNM stage: cT1c N0 Mx PSA: 4.8 Gleason score: 4+3=7 (GG 3) Biopsy (05/28/24): 5/12 cores positive Left: L lateral mid (5%, 3+4=7), L mid (5%, 3+4=7) Right: R mid (40%, 4+3=7), R lateral mid (5%, 3+4=7), R lateral base (5%, 3+3=6) Prostate volume: 23.7 cc Nomogram OC disease: 58% EPE: 39% SVI: 6% LNI: 9% PFS (5 year, 10 year): 68%, 53% Urinary function: IPSS is 9. Erectile function: SHIM score is 21. He will occasionally use sildenafil  but only rarely.     Social History Obtained and Reviewed October 06, 2024. Single Question Alcohol Screening: 0 days EtOH none: whisky once per week Smoking status - Cigar Smoker Years smoking: 5 Additional Details: cigar maybe once a week Tobacco Use Does not use vaping products Does not use smokeless tobacco Medications Obtained and Reviewed October 06, 2024. amlodipine  10 mg Oral - tablet chlorthalidone  15 mg Oral - tablet metformin  500 mg Oral - tablet rosuvastatin  10 mg Oral - tablet Allergies Reviewed October 06, 2024. Other: Lisinopril  Alerts No currently taking blood thinners and no allergy to latex. ROS Provider reviewed on Oct 06, 2024. A focused review of systems was performed including Cardiovascular, Constitutional / Symptom, Gastrointestinal (G.I.), Genitourinary (G.U.), Musculoskeletal, and Respiratory. No Blood In Urine, No Pain Or Burning With Urination, No Back Pain, No Chest Pain, No Swelling In Legs, No Fever Or Chills, No Unintentional Weight Loss, No Nausea/vomiting, And No Shortness Of Breath. Medical History Obtained and Reviewed October 06, 2024. Diabetes mellitus Malignant tumor of prostate Prostate specific antigen above reference range Other: HTN Surgical History Obtained and Reviewed October 06, 2024. Biopsy of prostate  Vitals: * Patient Reported Exam: Exam Appearance: well developed and nourished, in no acute distress Neck Exam: neck is supple Respiratory Effort: normal respiratory effort without labored breathing Heart Auscultation Exam: normal heart auscultation BMI BSA 30.7 2.2          Date Taken By B.P. Pulse Resp. O2 Sat. Temp. Ht. Wt.          10/06/24 15:44 [name] [name] 160/92 SIT 87 97.7 F 70.0 in 214.0 lbs  FiO2       AMANDA [facility] (Primary Provider) (Bill [name]) [phone] Work [phone] Fax [facility] [address] [name] [name] [place], [place] [address]     Visit Note - October 06, 2024 PMS ID: Sex: DOB: Phone: MRN: [name] [name] Male [birth year] [phone] [name] [name] [name] [name]   Carotids: normal right carotid exam without thrill or aneurysm, normal left carotid exam without thrill or aneurysm Skin Inspection: normal skin turgor Orientation:  alert and oriented to person, place, time Mood: mood and affect well-adjusted, pleasant and cooperative, appropriate for clinical and encounter circumstances Impression/Plan: 1. Prostate Cancer (C61) Plan: Follow-up as scheduled. The patient will follow-up  as scheduled. Plan: Additional Notes. There are no changes in the patients history or physical exam since last evaluation by Dr. Renda. Pt is scheduled to undergo RALP with BPLND on 10/12/24. All pt's questions were answered to the best of my ability. Staff: [name] [name] (Primary Provider) [name] [name]) [name] [name] Electronically Signed By: [name] [name], 10/06/2024 05:00 PM EDT

## 2024-10-12 ENCOUNTER — Observation Stay (HOSPITAL_COMMUNITY)
Admission: RE | Admit: 2024-10-12 | Discharge: 2024-10-13 | Disposition: A | Discharge status: Home / Self Care | Attending: Urology | Admitting: Urology

## 2024-10-12 ENCOUNTER — Ambulatory Visit (HOSPITAL_COMMUNITY): Payer: Self-pay | Admitting: Certified Registered"

## 2024-10-12 ENCOUNTER — Encounter (HOSPITAL_COMMUNITY): Admission: RE | Disposition: A | Payer: Self-pay | Source: Home / Self Care | Attending: Urology

## 2024-10-12 ENCOUNTER — Ambulatory Visit (HOSPITAL_COMMUNITY): Payer: Self-pay | Admitting: Medical

## 2024-10-12 ENCOUNTER — Encounter (HOSPITAL_COMMUNITY): Payer: Self-pay | Admitting: Urology

## 2024-10-12 ENCOUNTER — Other Ambulatory Visit: Payer: Self-pay

## 2024-10-12 DIAGNOSIS — Z01818 Encounter for other preprocedural examination: Secondary | ICD-10-CM

## 2024-10-12 DIAGNOSIS — F17210 Nicotine dependence, cigarettes, uncomplicated: Secondary | ICD-10-CM | POA: Diagnosis not present

## 2024-10-12 DIAGNOSIS — I10 Essential (primary) hypertension: Secondary | ICD-10-CM | POA: Diagnosis not present

## 2024-10-12 DIAGNOSIS — E119 Type 2 diabetes mellitus without complications: Secondary | ICD-10-CM | POA: Insufficient documentation

## 2024-10-12 DIAGNOSIS — F1090 Alcohol use, unspecified, uncomplicated: Secondary | ICD-10-CM | POA: Insufficient documentation

## 2024-10-12 DIAGNOSIS — Z79899 Other long term (current) drug therapy: Secondary | ICD-10-CM | POA: Diagnosis not present

## 2024-10-12 DIAGNOSIS — C61 Malignant neoplasm of prostate: Secondary | ICD-10-CM | POA: Diagnosis present

## 2024-10-12 HISTORY — PX: ROBOT ASSISTED LAPAROSCOPIC RADICAL PROSTATECTOMY: SHX5141

## 2024-10-12 LAB — HEMOGLOBIN AND HEMATOCRIT, BLOOD
HCT: 42.6 % (ref 39.0–52.0)
Hemoglobin: 15.3 g/dL (ref 13.0–17.0)

## 2024-10-12 LAB — GLUCOSE, CAPILLARY
Glucose-Capillary: 101 mg/dL — ABNORMAL HIGH (ref 70–99)
Glucose-Capillary: 183 mg/dL — ABNORMAL HIGH (ref 70–99)
Glucose-Capillary: 159 mg/dL — ABNORMAL HIGH (ref 70–99)

## 2024-10-12 LAB — ABO/RH: ABO/RH(D): O NEG

## 2024-10-12 SURGERY — PROSTATECTOMY, RADICAL, ROBOT-ASSISTED, LAPAROSCOPIC
Anesthesia: General

## 2024-10-12 MED ORDER — SUGAMMADEX SODIUM 200 MG/2ML IV SOLN
INTRAVENOUS | Status: DC | PRN
  Administered 2024-10-12: 200 mg via INTRAVENOUS

## 2024-10-12 MED ORDER — ROCURONIUM BROMIDE 10 MG/ML (PF) SYRINGE
PREFILLED_SYRINGE | INTRAVENOUS | Status: AC
  Filled 2024-10-12: qty 10

## 2024-10-12 MED ORDER — MORPHINE SULFATE (PF) 2 MG/ML IV SOLN: 2.0000 mg | INTRAVENOUS | Status: DC | PRN

## 2024-10-12 MED ORDER — ONDANSETRON HCL 4 MG/2ML IJ SOLN: 4.0000 mg | INTRAMUSCULAR | Status: DC | PRN

## 2024-10-12 MED ORDER — LIDOCAINE 2% (20 MG/ML) 5 ML SYRINGE
INTRAMUSCULAR | Status: DC | PRN
  Administered 2024-10-12: 1 mg/kg/h via INTRAVENOUS

## 2024-10-12 MED ORDER — CEFAZOLIN SODIUM-DEXTROSE 1-4 GM/50ML-% IV SOLN
1.0000 g | Freq: Three times a day (TID) | INTRAVENOUS | Status: AC
  Administered 2024-10-12 – 2024-10-13 (×2): 1 g via INTRAVENOUS
  Filled 2024-10-12 (×2): qty 50

## 2024-10-12 MED ORDER — BUPIVACAINE-EPINEPHRINE 0.25% -1:200000 IJ SOLN
INTRAMUSCULAR | Status: DC | PRN
  Administered 2024-10-12: 30 mL

## 2024-10-12 MED ORDER — AMISULPRIDE (ANTIEMETIC) 5 MG/2ML IV SOLN: 10.0000 mg | Freq: Once | INTRAVENOUS | Status: DC | PRN

## 2024-10-12 MED ORDER — DOCUSATE SODIUM 100 MG PO CAPS: 100.0000 mg | ORAL_CAPSULE | Freq: Two times a day (BID) | ORAL | Status: AC

## 2024-10-12 MED ORDER — PROPOFOL 10 MG/ML IV BOLUS
INTRAVENOUS | Status: DC | PRN
  Administered 2024-10-12: 200 mg via INTRAVENOUS

## 2024-10-12 MED ORDER — LACTATED RINGERS IV SOLN: INTRAVENOUS | Status: DC | PRN

## 2024-10-12 MED ORDER — DIPHENHYDRAMINE HCL 50 MG/ML IJ SOLN: 12.5000 mg | Freq: Four times a day (QID) | INTRAMUSCULAR | Status: DC | PRN

## 2024-10-12 MED ORDER — ONDANSETRON HCL 4 MG/2ML IJ SOLN: 4.0000 mg | Freq: Once | INTRAMUSCULAR | Status: DC | PRN

## 2024-10-12 MED ORDER — SUGAMMADEX SODIUM 200 MG/2ML IV SOLN
INTRAVENOUS | Status: AC
  Filled 2024-10-12: qty 2

## 2024-10-12 MED ORDER — PHENYLEPHRINE HCL-NACL 20-0.9 MG/250ML-% IV SOLN
INTRAVENOUS | Status: DC | PRN
  Administered 2024-10-12: 20 ug/min via INTRAVENOUS

## 2024-10-12 MED ORDER — OXYCODONE HCL 5 MG PO TABS
ORAL_TABLET | ORAL | Status: AC
  Filled 2024-10-12: qty 1

## 2024-10-12 MED ORDER — MIDAZOLAM HCL 2 MG/2ML IJ SOLN
INTRAMUSCULAR | Status: AC
Start: 2024-10-12 — End: 2024-10-12
  Filled 2024-10-12: qty 2

## 2024-10-12 MED ORDER — DEXMEDETOMIDINE HCL IN NACL 80 MCG/20ML IV SOLN
INTRAVENOUS | Status: DC | PRN
Start: 2024-10-12 — End: 2024-10-12
  Administered 2024-10-12: 8 ug via INTRAVENOUS

## 2024-10-12 MED ORDER — MIDAZOLAM HCL (PF) 2 MG/2ML IJ SOLN
INTRAMUSCULAR | Status: DC | PRN
  Administered 2024-10-12: 2 mg via INTRAVENOUS

## 2024-10-12 MED ORDER — ZOLPIDEM TARTRATE 5 MG PO TABS: 5.0000 mg | ORAL_TABLET | Freq: Every evening | ORAL | Status: DC | PRN

## 2024-10-12 MED ORDER — DEXAMETHASONE SOD PHOSPHATE PF 10 MG/ML IJ SOLN
INTRAMUSCULAR | Status: DC | PRN
  Administered 2024-10-12: 4 mg via INTRAVENOUS

## 2024-10-12 MED ORDER — SULFAMETHOXAZOLE-TRIMETHOPRIM 800-160 MG PO TABS: 1.0000 | ORAL_TABLET | Freq: Two times a day (BID) | ORAL | 0 refills | Status: DC

## 2024-10-12 MED ORDER — ONDANSETRON HCL 4 MG/2ML IJ SOLN
INTRAMUSCULAR | Status: AC
  Filled 2024-10-12: qty 2

## 2024-10-12 MED ORDER — ORAL CARE MOUTH RINSE: 15.0000 mL | Freq: Once | OROMUCOSAL | Status: AC

## 2024-10-12 MED ORDER — INSULIN ASPART 100 UNIT/ML IJ SOLN
INTRAMUSCULAR | Status: AC
  Filled 2024-10-12: qty 1

## 2024-10-12 MED ORDER — CHLORTHALIDONE 25 MG PO TABS
25.0000 mg | ORAL_TABLET | Freq: Every day | ORAL | Status: DC
  Administered 2024-10-12 – 2024-10-13 (×2): 25 mg via ORAL
  Filled 2024-10-12 (×2): qty 1

## 2024-10-12 MED ORDER — ACETAMINOPHEN 10 MG/ML IV SOLN: 1000.0000 mg | Freq: Once | INTRAVENOUS | Status: DC | PRN

## 2024-10-12 MED ORDER — LACTATED RINGERS IV SOLN: INTRAVENOUS | Status: DC

## 2024-10-12 MED ORDER — SODIUM CHLORIDE 0.9 % IV BOLUS
1000.0000 mL | Freq: Once | INTRAVENOUS | Status: AC
  Administered 2024-10-12: 1000 mL via INTRAVENOUS

## 2024-10-12 MED ORDER — FENTANYL CITRATE (PF) 100 MCG/2ML IJ SOLN
INTRAMUSCULAR | Status: AC
  Filled 2024-10-12: qty 2

## 2024-10-12 MED ORDER — FLUTICASONE PROPIONATE 50 MCG/ACT NA SUSP
1.0000 | Freq: Every day | NASAL | Status: DC
  Administered 2024-10-12: 1 via NASAL
  Filled 2024-10-12: qty 16

## 2024-10-12 MED ORDER — CHLORHEXIDINE GLUCONATE 0.12 % MT SOLN
15.0000 mL | Freq: Once | OROMUCOSAL | Status: AC
  Administered 2024-10-12: 15 mL via OROMUCOSAL

## 2024-10-12 MED ORDER — ONDANSETRON HCL 4 MG/2ML IJ SOLN
INTRAMUSCULAR | Status: DC | PRN
  Administered 2024-10-12: 4 mg via INTRAVENOUS

## 2024-10-12 MED ORDER — DEXMEDETOMIDINE HCL IN NACL 80 MCG/20ML IV SOLN
INTRAVENOUS | Status: AC
  Filled 2024-10-12: qty 20

## 2024-10-12 MED ORDER — ROSUVASTATIN CALCIUM 10 MG PO TABS
10.0000 mg | ORAL_TABLET | ORAL | Status: DC
  Administered 2024-10-12: 10 mg via ORAL
  Filled 2024-10-12: qty 1

## 2024-10-12 MED ORDER — POTASSIUM CHLORIDE IN NACL 20-0.45 MEQ/L-% IV SOLN
INTRAVENOUS | Status: DC
  Filled 2024-10-12 (×3): qty 1000

## 2024-10-12 MED ORDER — TRIPLE ANTIBIOTIC 3.5-400-5000 EX OINT: 1.0000 | TOPICAL_OINTMENT | Freq: Three times a day (TID) | CUTANEOUS | Status: DC | PRN

## 2024-10-12 MED ORDER — BUPIVACAINE-EPINEPHRINE (PF) 0.25% -1:200000 IJ SOLN
INTRAMUSCULAR | Status: AC
  Filled 2024-10-12: qty 30

## 2024-10-12 MED ORDER — ACETAMINOPHEN 325 MG PO TABS: 650.0000 mg | ORAL_TABLET | ORAL | Status: DC | PRN

## 2024-10-12 MED ORDER — HYDROMORPHONE HCL 1 MG/ML IJ SOLN: 0.2500 mg | INTRAMUSCULAR | Status: DC | PRN

## 2024-10-12 MED ORDER — KETOROLAC TROMETHAMINE 15 MG/ML IJ SOLN
15.0000 mg | Freq: Four times a day (QID) | INTRAMUSCULAR | Status: DC
  Administered 2024-10-12 – 2024-10-13 (×5): 15 mg via INTRAVENOUS
  Filled 2024-10-12 (×5): qty 1

## 2024-10-12 MED ORDER — HYOSCYAMINE SULFATE 0.125 MG SL SUBL: 0.1250 mg | SUBLINGUAL_TABLET | Freq: Four times a day (QID) | SUBLINGUAL | Status: DC | PRN

## 2024-10-12 MED ORDER — DIPHENHYDRAMINE HCL 12.5 MG/5ML PO ELIX: 12.5000 mg | ORAL_SOLUTION | Freq: Four times a day (QID) | ORAL | Status: DC | PRN

## 2024-10-12 MED ORDER — PROPOFOL 10 MG/ML IV BOLUS
INTRAVENOUS | Status: AC
  Filled 2024-10-12: qty 20

## 2024-10-12 MED ORDER — HEPARIN SODIUM (PORCINE) 1000 UNIT/ML IJ SOLN
INTRAMUSCULAR | Status: AC
Start: 2024-10-12 — End: 2024-10-12
  Filled 2024-10-12: qty 1

## 2024-10-12 MED ORDER — OXYCODONE HCL 5 MG/5ML PO SOLN: 5.0000 mg | Freq: Once | ORAL | Status: AC | PRN

## 2024-10-12 MED ORDER — INSULIN ASPART 100 UNIT/ML IJ SOLN
0.0000 [IU] | INTRAMUSCULAR | Status: DC | PRN
  Administered 2024-10-12: 4 [IU] via SUBCUTANEOUS

## 2024-10-12 MED ORDER — CEFAZOLIN SODIUM-DEXTROSE 2-4 GM/100ML-% IV SOLN
2.0000 g | INTRAVENOUS | Status: AC
  Administered 2024-10-12: 2 g via INTRAVENOUS
  Filled 2024-10-12: qty 100

## 2024-10-12 MED ORDER — TRAMADOL HCL 50 MG PO TABS: 50.0000 mg | ORAL_TABLET | Freq: Four times a day (QID) | ORAL | 0 refills | Status: AC | PRN

## 2024-10-12 MED ORDER — LIDOCAINE HCL (CARDIAC) PF 100 MG/5ML IV SOSY
PREFILLED_SYRINGE | INTRAVENOUS | Status: DC | PRN
  Administered 2024-10-12: 100 mg via INTRAVENOUS

## 2024-10-12 MED ORDER — AMLODIPINE BESYLATE 10 MG PO TABS
10.0000 mg | ORAL_TABLET | Freq: Every day | ORAL | Status: DC
  Administered 2024-10-13: 10 mg via ORAL
  Filled 2024-10-12: qty 1

## 2024-10-12 MED ORDER — LIDOCAINE HCL (PF) 2 % IJ SOLN
INTRAMUSCULAR | Status: AC
  Filled 2024-10-12: qty 15

## 2024-10-12 MED ORDER — STERILE WATER FOR IRRIGATION IR SOLN
Status: DC | PRN
  Administered 2024-10-12: 1000 mL

## 2024-10-12 MED ORDER — DOCUSATE SODIUM 100 MG PO CAPS
100.0000 mg | ORAL_CAPSULE | Freq: Two times a day (BID) | ORAL | Status: DC
  Administered 2024-10-12 – 2024-10-13 (×3): 100 mg via ORAL
  Filled 2024-10-12 (×3): qty 1

## 2024-10-12 MED ORDER — ORAL CARE MOUTH RINSE: 15.0000 mL | OROMUCOSAL | Status: DC | PRN

## 2024-10-12 MED ORDER — OXYCODONE HCL 5 MG PO TABS
5.0000 mg | ORAL_TABLET | Freq: Once | ORAL | Status: AC | PRN
  Administered 2024-10-12: 5 mg via ORAL

## 2024-10-12 MED ORDER — ROCURONIUM BROMIDE 10 MG/ML (PF) SYRINGE
PREFILLED_SYRINGE | INTRAVENOUS | Status: DC | PRN
  Administered 2024-10-12: 60 mg via INTRAVENOUS
  Administered 2024-10-12 (×2): 20 mg via INTRAVENOUS
  Administered 2024-10-12: 10 mg via INTRAVENOUS
  Administered 2024-10-12: 20 mg via INTRAVENOUS

## 2024-10-12 MED ORDER — FENTANYL CITRATE (PF) 100 MCG/2ML IJ SOLN
INTRAMUSCULAR | Status: DC | PRN
  Administered 2024-10-12: 50 ug via INTRAVENOUS
  Administered 2024-10-12: 100 ug via INTRAVENOUS
  Administered 2024-10-12: 50 ug via INTRAVENOUS

## 2024-10-12 SURGICAL SUPPLY — 54 items
APPLICATOR COTTON TIP 6 STRL (MISCELLANEOUS) ×2 IMPLANT
BAG COUNTER SPONGE SURGICOUNT (BAG) IMPLANT
CATH FOLEY 2WAY SLVR 18FR 30CC (CATHETERS) ×2 IMPLANT
CATH ROBINSON RED A/P 16FR (CATHETERS) ×2 IMPLANT
CATH ROBINSON RED A/P 8FR (CATHETERS) ×2 IMPLANT
CATH TIEMANN FOLEY 18FR 5CC (CATHETERS) ×2 IMPLANT
CHLORAPREP W/TINT 26 (MISCELLANEOUS) ×2 IMPLANT
CLIP LIGATING HEM O LOK PURPLE (MISCELLANEOUS) ×2 IMPLANT
COVER SURGICAL LIGHT HANDLE (MISCELLANEOUS) ×2 IMPLANT
COVER TIP SHEARS 8 DVNC (MISCELLANEOUS) ×2 IMPLANT
CUTTER ECHEON FLEX ENDO 45 340 (ENDOMECHANICALS) ×2 IMPLANT
DERMABOND ADVANCED .7 DNX12 (GAUZE/BANDAGES/DRESSINGS) ×2 IMPLANT
DRAPE ARM DVNC X/XI (DISPOSABLE) ×8 IMPLANT
DRAPE COLUMN DVNC XI (DISPOSABLE) ×2 IMPLANT
DRAPE SURG IRRIG POUCH 19X23 (DRAPES) ×2 IMPLANT
DRIVER NDL LRG 8 DVNC XI (INSTRUMENTS) ×4 IMPLANT
DRIVER NDLE LRG 8 DVNC XI (INSTRUMENTS) ×4 IMPLANT
DRSG TEGADERM 4X4.75 (GAUZE/BANDAGES/DRESSINGS) ×2 IMPLANT
ELECT PENCIL ROCKER SW 15FT (MISCELLANEOUS) ×2 IMPLANT
ELECT REM PT RETURN 15FT ADLT (MISCELLANEOUS) ×2 IMPLANT
FORCEPS BPLR LNG DVNC XI (INSTRUMENTS) ×2 IMPLANT
FORCEPS PROGRASP DVNC XI (FORCEP) ×2 IMPLANT
GAUZE SPONGE 4X4 12PLY STRL (GAUZE/BANDAGES/DRESSINGS) ×2 IMPLANT
GLOVE BIO SURGEON STRL SZ 6.5 (GLOVE) ×2 IMPLANT
GLOVE SURG LX STRL 7.5 STRW (GLOVE) ×4 IMPLANT
GOWN STRL REUS W/ TWL XL LVL3 (GOWN DISPOSABLE) ×4 IMPLANT
GOWN STRL SURGICAL XL XLNG (GOWN DISPOSABLE) ×2 IMPLANT
HOLDER FOLEY CATH W/STRAP (MISCELLANEOUS) ×2 IMPLANT
IRRIGATION SUCT STRKRFLW 2 WTP (MISCELLANEOUS) ×2 IMPLANT
IV LACTATED RINGERS 1000ML (IV SOLUTION) ×2 IMPLANT
KIT TURNOVER KIT A (KITS) ×2 IMPLANT
NDL SAFETY ECLIPSE 18X1.5 (NEEDLE) IMPLANT
PACK ROBOT UROLOGY CUSTOM (CUSTOM PROCEDURE TRAY) ×2 IMPLANT
PLUG CATH AND CAP STRL 200 (CATHETERS) ×2 IMPLANT
RELOAD STAPLE 45 4.1 GRN THCK (STAPLE) ×2 IMPLANT
SCISSORS LAP 5X45 EPIX DISP (ENDOMECHANICALS) IMPLANT
SCISSORS MNPLR CVD DVNC XI (INSTRUMENTS) ×2 IMPLANT
SEAL UNIV 5-12 XI (MISCELLANEOUS) ×8 IMPLANT
SET TUBE SMOKE EVAC HIGH FLOW (TUBING) ×2 IMPLANT
SOL PREP POV-IOD 4OZ 10% (MISCELLANEOUS) ×2 IMPLANT
SOLUTION ELECTROSURG ANTI STCK (MISCELLANEOUS) ×2 IMPLANT
SPIKE FLUID TRANSFER (MISCELLANEOUS) ×2 IMPLANT
SUT ETHILON 3 0 PS 1 (SUTURE) ×2 IMPLANT
SUT MNCRL 3 0 RB1 (SUTURE) ×2 IMPLANT
SUT MNCRL 3 0 VIOLET RB1 (SUTURE) ×2 IMPLANT
SUT MNCRL AB 4-0 PS2 18 (SUTURE) ×4 IMPLANT
SUT PDS PLUS AB 0 CT-2 (SUTURE) ×4 IMPLANT
SUT VIC AB 0 CT1 27XBRD ANTBC (SUTURE) ×4 IMPLANT
SUT VIC AB 2-0 SH 27X BRD (SUTURE) ×2 IMPLANT
SUT VIC AB 3-0 SH 27X BRD (SUTURE) IMPLANT
SUT VIC AB 3-0 SH 27XBRD (SUTURE) IMPLANT
SYR 27GX1/2 1ML LL SAFETY (SYRINGE) ×2 IMPLANT
TROCAR Z THREAD OPTICAL 12X100 (TROCAR) IMPLANT
WATER STERILE IRR 1000ML POUR (IV SOLUTION) ×2 IMPLANT

## 2024-10-12 NOTE — Discharge Instructions (Signed)

## 2024-10-12 NOTE — Progress Notes (Addendum)
 Post-op note  Subjective: The patient is doing well.  No complaints.   Objective: Vital signs in last 24 hours: Temp:  [97.7 F (36.5 C)-98.5 F (36.9 C)] 97.7 F (36.5 C) (10/20 1425) Pulse Rate:  [77-90] 89 (10/20 1425) Resp:  [16-20] 19 (10/20 1400) BP: (114-156)/(63-86) 156/84 (10/20 1425) SpO2:  [94 %-100 %] 94 % (10/20 1425) Weight:  [95.7 kg] 95.7 kg (10/20 0556)  Intake/Output from previous day: No intake/output data recorded. Intake/Output this shift: Total I/O In: 2700 [P.O.:100; I.V.:1500; IV Piggyback:1100] Out: 350 [Urine:300; Drains:30; Blood:20]  Physical Exam:  General: Alert and oriented. Abdomen: Soft, Nondistended. Incisions: Clean and dry. GU: Foley catheter draining   Lab Results: Recent Labs    10/12/24 1127  HGB 15.3  HCT 42.6    Assessment/Plan: POD#0   1) Continue to monitor    LOS: 0 days   Lyle LOISE Civil 10/12/2024, 3:21 PM    Pt with left upper extremity numbness particularly in his left hand.  Does not have complete numbness and does not follow median or ulnar nerve distribution.  Will monitor.

## 2024-10-12 NOTE — Progress Notes (Signed)
 CBG 183, notified Dr. Jefm, Novolog 4 unit  [place] order received.

## 2024-10-12 NOTE — Interval H&P Note (Signed)
 History and Physical Interval Note:  10/12/2024 7:05 AM  [name] [name]  has presented today for surgery, with the diagnosis of PROSTATE CANCER.  The various methods of treatment have been discussed with the patient and family. After consideration of risks, benefits and other options for treatment, the patient has consented to  Procedure(s) with comments: PROSTATECTOMY, RADICAL, ROBOT-ASSISTED, LAPAROSCOPIC (N/A) - LEVEL 2 LYMPHADENECTOMY, PELVIS, ROBOT-ASSISTED (Bilateral) as a surgical intervention.  The patient's history has been reviewed, patient examined, no change in status, stable for surgery.  I have reviewed the patient's chart and labs.  Questions were answered to the patient's satisfaction.     [employer]

## 2024-10-12 NOTE — Op Note (Signed)
 Preoperative diagnosis: Clinically localized adenocarcinoma of the prostate (clinical stage T1c N0 Mx)  Postoperative diagnosis: Clinically localized adenocarcinoma of the prostate (clinical stage T1c N0 Mx)  Procedure:  Robotic assisted laparoscopic radical prostatectomy (bilateral nerve sparing) Bilateral robotic assisted laparoscopic pelvic lymphadenectomy  Surgeon: Gretel CANDIE Renda Mickey. M.D.  Assistant: Alan Hammonds, PA-C  An assistant was required for this surgical procedure.  The duties of the assistant included but were not limited to suctioning, passing suture, camera manipulation, retraction. This procedure would not be able to be performed without an Geophysicist/field seismologist.  Resident: Dr. Lyle Civil  Anesthesia: General  Complications: None  EBL: 50 mL  IVF:  1200 mL crystalloid  Specimens: Prostate and seminal vesicles Right pelvic lymph nodes Left pelvic lymph nodes  Disposition of specimens: Pathology  Drains: 20 Fr coude catheter # 19 Blake pelvic drain  Indication: [name] [name] is a 53 y.o. year old patient with clinically localized prostate cancer.  After a thorough review of the management options for treatment of prostate cancer, he elected to proceed with surgical therapy and the above procedure(s).  We have discussed the potential benefits and risks of the procedure, side effects of the proposed treatment, the likelihood of the patient achieving the goals of the procedure, and any potential problems that might occur during the procedure or recuperation. Informed consent has been obtained.  Description of procedure:  The patient was taken to the operating room and a general anesthetic was administered. He was given preoperative antibiotics, placed in the dorsal lithotomy position, and prepped and draped in the usual sterile fashion. Next a preoperative timeout was performed. A urethral catheter was placed into the bladder and a site was selected near the  umbilicus for placement of the camera port. This was placed using a standard open Hassan technique which allowed entry into the peritoneal cavity under direct vision and without difficulty. An 8 mm robotic port was placed and a pneumoperitoneum established. The camera was then used to inspect the abdomen and there was no evidence of any intra-abdominal injuries or other abnormalities. The remaining abdominal ports were then placed. 8 mm robotic ports were placed in the right lower quadrant, left lower quadrant, and far left lateral abdominal wall. A 5 mm port was placed in the right upper quadrant and a 12 mm port was placed in the right lateral abdominal wall for laparoscopic assistance. All ports were placed under direct vision without difficulty. The surgical cart was then docked.   Utilizing the cautery scissors, the bladder was reflected posteriorly allowing entry into the space of Retzius and identification of the endopelvic fascia and prostate. The periprostatic fat was then removed from the prostate allowing full exposure of the endopelvic fascia. The endopelvic fascia was then incised from the apex back to the base of the prostate bilaterally and the underlying levator muscle fibers were swept laterally off the prostate thereby isolating the dorsal venous complex. The dorsal vein was then stapled and divided with a 45 mm Flex Echelon stapler. Attention then turned to the bladder neck which was divided anteriorly thereby allowing entry into the bladder and exposure of the urethral catheter. The catheter balloon was deflated and the catheter was brought into the operative field and used to retract the prostate anteriorly. The posterior bladder neck was then examined and was divided allowing further dissection between the bladder and prostate posteriorly until the vasa deferentia and seminal vessels were identified. The vasa deferentia were isolated, divided, and lifted anteriorly. The seminal vesicles  were  dissected down to their tips with care to control the seminal vascular arterial blood supply. These structures were then lifted anteriorly and the space between Denonvillier's fascia and the anterior rectum was developed with a combination of sharp and blunt dissection. This isolated the vascular pedicles of the prostate.  The lateral prostatic fascia was then sharply incised allowing release of the neurovascular bundles bilaterally. The vascular pedicles of the prostate were then ligated with Weck clips between the prostate and neurovascular bundles and divided with sharp cold scissor dissection resulting in neurovascular bundle preservation. The neurovascular bundles were then separated off the apex of the prostate and urethra bilaterally.  The urethra was then sharply transected allowing the prostate specimen to be disarticulated. The pelvis was copiously irrigated and hemostasis was ensured. There was no evidence for rectal injury.  Attention then turned to the right pelvic sidewall. The fibrofatty tissue between the external iliac vein, confluence of the iliac vessels, hypogastric artery, and Cooper's ligament was dissected free from the pelvic sidewall with care to preserve the obturator nerve. Weck clips were used for lymphostasis and hemostasis. An identical procedure was performed on the contralateral side and the lymphatic packets were removed for permanent pathologic analysis.  Attention then turned to the urethral anastomosis. A 2-0 Vicryl slip knot was placed between Denonvillier's fascia, the posterior bladder neck, and the posterior urethra to reapproximate these structures. A double-armed 3-0 Monocryl suture was then used to perform a 360 running tension-free anastomosis between the bladder neck and urethra. A new urethral catheter was then placed into the bladder and irrigated. There were no blood clots within the bladder and the anastomosis appeared to be watertight. A #19 Blake drain was  then brought through the left lateral 8 mm port site and positioned appropriately within the pelvis. It was secured to the skin with a nylon suture. The surgical cart was then undocked. The right lateral 12 mm port site was closed at the fascial level with a 0 Vicryl suture placed laparoscopically. All remaining ports were then removed under direct vision. The prostate specimen was removed intact within the Endopouch retrieval bag via the periumbilical camera port site. This fascial opening was closed with two running 0 PDS sutures. 0.25% Marcaine was then injected into all port sites and all incisions were reapproximated at the skin level with 4-0 Monocryl subcuticular sutures and Dermabond. The patient appeared to tolerate the procedure well and without complications. The patient was able to be extubated and transferred to the recovery unit in satisfactory condition.   Gretel CANDIE Renda Teddie MD

## 2024-10-12 NOTE — Anesthesia Postprocedure Evaluation (Signed)
 Anesthesia Post Note  Patient: [name] [name]  Procedure(s) Performed: PROSTATECTOMY, RADICAL, ROBOT-ASSISTED, LAPAROSCOPIC LYMPHADENECTOMY, PELVIS, ROBOT-ASSISTED (Bilateral)     Patient location during evaluation: PACU Anesthesia Type: General Level of consciousness: awake and alert Pain management: pain level controlled Vital Signs Assessment: post-procedure vital signs reviewed and stable Respiratory status: spontaneous breathing, nonlabored ventilation, respiratory function stable and patient connected to nasal cannula oxygen Cardiovascular status: blood pressure returned to baseline and stable Postop Assessment: no apparent nausea or vomiting Anesthetic complications: no   No notable events documented.  Last Vitals:  Vitals:   10/12/24 1400 10/12/24 1425  BP: 126/81 (!) 156/84  Pulse: 81 89  Resp: 19   Temp: 36.6 C 36.5 C  SpO2: 94% 94%    Last Pain:  Vitals:   10/12/24 1500  TempSrc:   PainSc: 3                  Garnette DELENA Gab

## 2024-10-12 NOTE — Transfer of Care (Signed)
 Immediate Anesthesia Transfer of Care Note  Patient: [name] [name]  Procedure(s) Performed: PROSTATECTOMY, RADICAL, ROBOT-ASSISTED, LAPAROSCOPIC LYMPHADENECTOMY, PELVIS, ROBOT-ASSISTED (Bilateral)  Patient Location: PACU  Anesthesia Type:General  Level of Consciousness: awake and drowsy  Airway & Oxygen Therapy: Patient Spontanous Breathing and Patient connected to nasal cannula oxygen  Post-op Assessment: Report given to RN and Post -op Vital signs reviewed and stable  Post vital signs: Reviewed and stable  Last Vitals:  Vitals Value Taken Time  BP 116/64 10/12/24 10:49  Temp 36.9 C 10/12/24 10:49  Pulse 87 10/12/24 10:49  Resp 16 10/12/24 10:49  SpO2 96 % 10/12/24 10:49  Vitals shown include unfiled device data.  Last Pain:  Vitals:   10/12/24 0556  TempSrc:   PainSc: 0-No pain      Patients Stated Pain Goal: 3 (10/12/24 0556)  Complications: No notable events documented.

## 2024-10-12 NOTE — Anesthesia Procedure Notes (Signed)
 Procedure Name: Intubation Date/Time: 10/12/2024 7:30 AM  Performed by: Metta Andrea NOVAK, CRNAPre-anesthesia Checklist: Patient identified, Emergency Drugs available, Suction available, Patient being monitored and Timeout performed Patient Re-evaluated:Patient Re-evaluated prior to induction Oxygen Delivery Method: Circle system utilized Preoxygenation: Pre-oxygenation with 100% oxygen Induction Type: IV induction Ventilation: Mask ventilation without difficulty and Oral airway inserted - appropriate to patient size Laryngoscope Size: Glidescope and 3 Tube type: Oral Tube size: 7.5 mm Number of attempts: 1 Airway Equipment and Method: Stylet Placement Confirmation: ETT inserted through vocal cords under direct vision, positive ETCO2 and breath sounds checked- equal and bilateral Secured at: 23 cm Tube secured with: Tape Dental Injury: Teeth and Oropharynx as per pre-operative assessment  Comments: Paramedic student Natalie glidsescope x 1, glidescope x 1 by Andrea Metta, CRNA. Atraumatic intubation +/= BBS, +EtCO2.

## 2024-10-13 ENCOUNTER — Encounter (HOSPITAL_COMMUNITY): Payer: Self-pay | Admitting: Urology

## 2024-10-13 DIAGNOSIS — C61 Malignant neoplasm of prostate: Secondary | ICD-10-CM | POA: Diagnosis not present

## 2024-10-13 LAB — HEMOGLOBIN AND HEMATOCRIT, BLOOD
HCT: 42.1 % (ref 39.0–52.0)
Hemoglobin: 14.7 g/dL (ref 13.0–17.0)

## 2024-10-13 MED ORDER — BISACODYL 10 MG RE SUPP
10.0000 mg | Freq: Once | RECTAL | Status: AC
  Administered 2024-10-13: 10 mg via RECTAL
  Filled 2024-10-13: qty 1

## 2024-10-13 MED ORDER — TRAMADOL HCL 50 MG PO TABS: 50.0000 mg | ORAL_TABLET | Freq: Four times a day (QID) | ORAL | Status: DC | PRN

## 2024-10-13 NOTE — Progress Notes (Signed)
   10/13/24 0832  TOC Brief Assessment  Insurance and Status Reviewed  Patient has primary care physician Yes  Home environment has been reviewed Resides in single family home with spouse  Prior level of function: Independent with ADLs at baseline  Prior/Current Home Services No current home services  Social Drivers of Health Review SDOH reviewed no interventions necessary  Readmission risk has been reviewed Yes  Transition of care needs no transition of care needs at this time

## 2024-10-13 NOTE — Plan of Care (Signed)
  Problem: Education: Goal: Knowledge of the procedure and recovery process will improve Outcome: Completed/Met   Problem: Bowel/Gastric: Goal: Gastrointestinal status for postoperative course will improve Outcome: Completed/Met   Problem: Pain Management: Goal: General experience of comfort will improve Outcome: Completed/Met   Problem: Skin Integrity: Goal: Demonstration of wound healing without infection will improve Outcome: Completed/Met   Problem: Urinary Elimination: Goal: Ability to avoid or minimize complications of infection will improve Outcome: Completed/Met Goal: Ability to achieve and maintain urine output will improve Outcome: Completed/Met Goal: Home care management will improve Outcome: Completed/Met   Problem: Education: Goal: Knowledge of General Education information will improve Description: Including pain rating scale, medication(s)/side effects and non-pharmacologic comfort measures Outcome: Completed/Met   Problem: Health Behavior/Discharge Planning: Goal: Ability to manage health-related needs will improve Outcome: Completed/Met   Problem: Clinical Measurements: Goal: Ability to maintain clinical measurements within normal limits will improve Outcome: Completed/Met Goal: Will remain free from infection Outcome: Completed/Met Goal: Diagnostic test results will improve Outcome: Completed/Met Goal: Respiratory complications will improve Outcome: Completed/Met Goal: Cardiovascular complication will be avoided Outcome: Completed/Met   Problem: Activity: Goal: Risk for activity intolerance will decrease Outcome: Completed/Met   Problem: Nutrition: Goal: Adequate nutrition will be maintained Outcome: Completed/Met   Problem: Coping: Goal: Level of anxiety will decrease Outcome: Completed/Met   Problem: Elimination: Goal: Will not experience complications related to bowel motility Outcome: Completed/Met Goal: Will not experience  complications related to urinary retention Outcome: Completed/Met   Problem: Pain Managment: Goal: General experience of comfort will improve and/or be controlled Outcome: Completed/Met   Problem: Safety: Goal: Ability to remain free from injury will improve Outcome: Completed/Met   Problem: Skin Integrity: Goal: Risk for impaired skin integrity will decrease Outcome: Completed/Met

## 2024-10-13 NOTE — Progress Notes (Signed)
 1 Day Post-Op Subjective: The patient is doing well.  No nausea or vomiting. Pain is adequately controlled. Continues to endorse some numbness along the radial distribution of his arms bilaterally, left slightly weaker than right.   Objective: Vital signs in last 24 hours: Temp:  [97.7 F (36.5 C)-99.4 F (37.4 C)] 98 F (36.7 C) (10/21 0335) Pulse Rate:  [79-90] 81 (10/21 0335) Resp:  [16-20] 19 (10/21 0335) BP: (114-156)/(63-84) 142/83 (10/21 0335) SpO2:  [94 %-100 %] 97 % (10/21 0335)  Intake/Output from previous day: 10/20 0701 - 10/21 0700 In: 4616.1 [P.O.:100; I.V.:3416.1; IV Piggyback:1100] Out: 3310 [Urine:3150; Drains:140; Blood:20] Intake/Output this shift: No intake/output data recorded.  Physical Exam:  General: Alert and oriented. CV: RRR Lungs: Clear bilaterally. GI: Soft, Nondistended. Incisions: Clean, dry, and intact Urine: Clear Extremities: Nontender, no erythema, no edema.  Lab Results: Recent Labs    10/12/24 1127 10/13/24 0503  HGB 15.3 14.7  HCT 42.6 42.1      Assessment/Plan: POD# 1 s/p robotic prostatectomy.  1) SL IVF 2) Ambulate, Incentive spirometry 3) Transition to oral pain medication 4) Bowel regimen 5) D/C pelvic drain 6) Plan for likely discharge later today    LOS: 0 days   [employer] 10/13/2024, 7:01 AM

## 2024-10-13 NOTE — Discharge Summary (Signed)
 Date of admission: 10/12/2024  Date of discharge: 10/13/2024  Admission diagnosis: Prostate Cancer  Discharge diagnosis: Prostate Cancer  History and Physical: For full details, please see admission history and physical. Briefly, [name] [name] is a 53 y.o. gentleman with localized prostate cancer.  After discussing management/treatment options, he elected to proceed with surgical treatment.  Hospital Course: [name] [name] was taken to the operating room on 10/12/2024 and underwent a robotic assisted laparoscopic radical prostatectomy. He tolerated this procedure well and without complications. Postoperatively, he was able to be transferred to a regular hospital room following recovery from anesthesia.  He was able to begin ambulating the night of surgery. He remained hemodynamically stable overnight.  He had excellent urine output with appropriately minimal output from his pelvic drain and his pelvic drain was removed on POD #1.  He was transitioned to oral pain medication, tolerated a clear liquid diet, and had met all discharge criteria and was able to be discharged home later on POD#1.  Laboratory values:  Recent Labs    10/12/24 1127 10/13/24 0503  HGB 15.3 14.7  HCT 42.6 42.1    Disposition: Home  Discharge instruction: He was instructed to be ambulatory but to refrain from heavy lifting, strenuous activity, or driving. He was instructed on urethral catheter care.  Discharge medications:   Allergies as of 10/13/2024       Reactions   Lisinopril  Other (See Comments)   Vertigo thought potentially caused by lisinopril - resolved off med        Medication List     STOP taking these medications    Mens 50+ Multivitamin Tabs       TAKE these medications    amLODipine  10 MG tablet Commonly known as: NORVASC  Take 1 tablet (10 mg total) by mouth daily.   chlorthalidone  25 MG tablet Commonly known as: HYGROTON  Take 1 tablet (25 mg total) by mouth daily.    docusate sodium 100 MG capsule Commonly known as: COLACE Take 1 capsule (100 mg total) by mouth 2 (two) times daily.   etodolac  300 MG capsule Commonly known as: LODINE  Take 1 capsule (300 mg total) by mouth every 8 (eight) hours as needed (for sparing back pain).   fluticasone  50 MCG/ACT nasal spray Commonly known as: FLONASE  USE TWO SPRAYS IN EACH NOSTRIL ONE TIME DAILY   metFORMIN  500 MG tablet Commonly known as: GLUCOPHAGE  TAKE 1 TABLET BY MOUTH EVERY DAY WITH BREAKFAST   rosuvastatin  10 MG tablet Commonly known as: CRESTOR  Take 1 tablet (10 mg total) by mouth once a week.   sildenafil  100 MG tablet Commonly known as: VIAGRA  TAKE ONE TABLET BY MOUTH ONE TIME DAILY AS NEEDED FOR ERECTILE DYSFUNCTION   sulfamethoxazole-trimethoprim 800-160 MG tablet Commonly known as: BACTRIM DS Take 1 tablet by mouth 2 (two) times daily. Start the day prior to foley removal appointment   traMADol 50 MG tablet Commonly known as: Ultram Take 1-2 tablets (50-100 mg total) by mouth every 6 (six) hours as needed for moderate pain (pain score 4-6) or severe pain (pain score 7-10).   triamcinolone  cream 0.1 % Commonly known as: KENALOG  APPLY TOPICALLY TWO TIMES A DAY FOR ECZEMA FLARES FOR A MAXIMUM OF 10 DAYS What changed: See the new instructions.   valACYclovir  500 MG tablet Commonly known as: VALTREX  TAKE 1 TABLET (500 MG TOTAL) BY MOUTH 2 (TWO) TIMES DAILY OR AS NEEDED FOR 3 DAYS DURING FLARE        Followup: He will followup in 1 week  for catheter removal and to discuss his surgical pathology results.

## 2024-10-14 ENCOUNTER — Encounter: Admitting: Family Medicine

## 2024-10-15 LAB — SURGICAL PATHOLOGY

## 2024-12-03 ENCOUNTER — Encounter: Payer: Self-pay | Admitting: Family Medicine

## 2024-12-22 ENCOUNTER — Ambulatory Visit: Payer: Self-pay

## 2024-12-22 NOTE — Telephone Encounter (Signed)
 Noted

## 2024-12-22 NOTE — Telephone Encounter (Signed)
 FYI Only or Action Required?: FYI only for provider: Ortho urgent care advised.  Patient was last seen in primary care on 07/02/2024 by Katrinka Garnette KIDD, MD.  Called Nurse Triage reporting Shoulder Injury.  Symptoms began several months ago.  Interventions attempted: OTC medications: Aleve.  Symptoms are: not improving.  Triage Disposition: See Physician Within 24 Hours  Patient/caregiver understands and will follow disposition?: Yes        Copied from CRM #[id number]. Topic: Clinical - Red Word Triage >> Dec 22, 2024  2:58 PM Drema MATSU wrote: Red Word that prompted transfer to Nurse Triage: Patient fell down the steps and landed on his shoulder. He states that it is swollen and the pain won't go away.  *It happened a couple of months ago. Reason for Disposition  Can't move injured shoulder normally (e.g., able to touch top of head, good range of motion)  Answer Assessment - Initial Assessment Questions 1. MECHANISM: How did the injury happen?     Slipped and slid down the steps, fell on his right shoulder.  2. ONSET: When did the injury happen? (e.g., minutes, hours ago)      End of September.  3. APPEARANCE of INJURY: What does the injury look like?      Swelling to collarbone. No open cuts/lacerations or bruising.  4. SEVERITY: Can you move the shoulder normally?      No. Can't raise his right arm out to touch the top of his head and can't reach back.  5. SIZE: For cuts, bruises, or swelling, ask: How large is it? (e.g., inches or centimeters;  entire joint)      Swelling to collarbone is about the size of a marble.  6. PAIN: Is there pain? If Yes, ask: How bad is the pain?  (Scale 0-10; or none, mild, moderate, severe)     Yes, 6-7/10. Treating with Aleve. States pain is not in the collarbone but the right shoulder and back behind the shoulder.  7. TETANUS: For any breaks in the skin, ask: When was your last tetanus booster?     N/A.  8. OTHER  SYMPTOMS: Do you have any other symptoms? (e.g., loss of sensation)     No.  Protocols used: Shoulder Injury-A-AH

## 2025-01-05 ENCOUNTER — Ambulatory Visit: Admitting: Family Medicine

## 2025-01-05 ENCOUNTER — Encounter: Payer: Self-pay | Admitting: Family Medicine

## 2025-01-05 VITALS — BP 132/78 | HR 100 | Temp 98.4°F | Ht 70.0 in | Wt 208.0 lb

## 2025-01-05 DIAGNOSIS — B9789 Other viral agents as the cause of diseases classified elsewhere: Secondary | ICD-10-CM | POA: Diagnosis not present

## 2025-01-05 DIAGNOSIS — I10 Essential (primary) hypertension: Secondary | ICD-10-CM

## 2025-01-05 DIAGNOSIS — Z7984 Long term (current) use of oral hypoglycemic drugs: Secondary | ICD-10-CM | POA: Diagnosis not present

## 2025-01-05 DIAGNOSIS — R051 Acute cough: Secondary | ICD-10-CM | POA: Diagnosis not present

## 2025-01-05 DIAGNOSIS — J329 Chronic sinusitis, unspecified: Secondary | ICD-10-CM

## 2025-01-05 DIAGNOSIS — E119 Type 2 diabetes mellitus without complications: Secondary | ICD-10-CM | POA: Diagnosis not present

## 2025-01-05 LAB — POC COVID19 BINAXNOW: SARS Coronavirus 2 Ag: NEGATIVE

## 2025-01-05 LAB — POCT INFLUENZA A/B
Influenza A, POC: NEGATIVE
Influenza B, POC: NEGATIVE

## 2025-01-05 NOTE — Progress Notes (Signed)
 " Phone [phone] In person visit   Subjective:   [name] [name] is a 54 y.o. year old very pleasant male patient who presents for/with See problem oriented charting Chief Complaint  Patient presents with   Acute Visit    Cold and Flu like symptoms that started late Sunday; cough, runny nose, fatigue, sinus pressure, headache, sore throat; no fever;     Past Medical History-  Patient Active Problem List   Diagnosis Date Noted   Malignant neoplasm of prostate (HCC) 06/29/2024    Priority: High   Aortic atherosclerosis 07/02/2024    Priority: Medium    Low back pain without sciatica 10/24/2018    Priority: Medium    Atopic dermatitis 10/15/2017    Priority: Medium    Type 2 diabetes mellitus without complications (HCC) 09/16/2015    Priority: Medium    Hyperlipidemia associated with type 2 diabetes mellitus (HCC) 09/16/2015    Priority: Medium    Hypertension 09/09/2014    Priority: Medium    Erectile dysfunction 01/08/2020    Priority: Low   Benign paroxysmal positional vertigo 11/03/2016    Priority: Low   Genital herpes 09/09/2014    Priority: Low   Prostate cancer (HCC) 10/12/2024    Medications- reviewed and updated Current Outpatient Medications  Medication Sig Dispense Refill   amLODipine  (NORVASC ) 10 MG tablet Take 1 tablet (10 mg total) by mouth daily. 90 tablet 3   chlorthalidone  (HYGROTON ) 25 MG tablet Take 1 tablet (25 mg total) by mouth daily. 90 tablet 3   docusate sodium  (COLACE) 100 MG capsule Take 1 capsule (100 mg total) by mouth 2 (two) times daily.     etodolac  (LODINE ) 300 MG capsule Take 1 capsule (300 mg total) by mouth every 8 (eight) hours as needed (for sparing back pain). 30 capsule 1   fluticasone  (FLONASE ) 50 MCG/ACT nasal spray USE TWO SPRAYS IN EACH NOSTRIL ONE TIME DAILY 48 mL 3   metFORMIN  (GLUCOPHAGE ) 500 MG tablet TAKE 1 TABLET BY MOUTH EVERY DAY WITH BREAKFAST 90 tablet 3   rosuvastatin  (CRESTOR ) 10 MG tablet Take 1 tablet (10 mg  total) by mouth once a week. 13 tablet 3   sildenafil  (VIAGRA ) 100 MG tablet TAKE ONE TABLET BY MOUTH ONE TIME DAILY AS NEEDED FOR ERECTILE DYSFUNCTION 10 tablet 11   traMADol  (ULTRAM ) 50 MG tablet Take 1-2 tablets (50-100 mg total) by mouth every 6 (six) hours as needed for moderate pain (pain score 4-6) or severe pain (pain score 7-10). 20 tablet 0   triamcinolone  cream (KENALOG ) 0.1 % APPLY TOPICALLY TWO TIMES A DAY FOR ECZEMA FLARES FOR A MAXIMUM OF 10 DAYS (Patient taking differently: Apply 1 Application topically 2 (two) times daily as needed (eczema).) 80 g 2   valACYclovir  (VALTREX ) 500 MG tablet TAKE 1 TABLET (500 MG TOTAL) BY MOUTH 2 (TWO) TIMES DAILY OR AS NEEDED FOR 3 DAYS DURING FLARE 180 tablet 1   No current facility-administered medications for this visit.     Objective:  BP 132/78 (BP Location: Left Arm, Patient Position: Sitting, Cuff Size: Normal)   Pulse 100   Temp 98.4 F (36.9 C) (Temporal)   Ht 5' 10 (1.778 m)   Wt 208 lb (94.3 kg)   SpO2 96%   BMI 29.84 kg/m  Gen: NAD, resting comfortably Currently maxillary sinuses mildly tender to palpation.  Nasal turbinates edematous and erythematous with clear discharge noted, pharynx with mild erythema and drainage CV: RRR no murmurs rubs or gallops Lungs:  CTAB no crackles, wheeze, rhonchi Ext: no edema Skin: warm, dry  Results for orders placed or performed in visit on 01/05/25 (from the past 24 hours)  POC COVID-19     Status: None   Collection Time: 01/05/25  3:51 PM  Result Value Ref Range   SARS Coronavirus 2 Ag Negative Negative  POCT Influenza A/B     Status: None   Collection Time: 01/05/25  3:51 PM  Result Value Ref Range   Influenza A, POC Negative Negative   Influenza B, POC Negative Negative       Assessment and Plan   # cough/congestion.  S: Patient with cold flulike symptoms including cough, runny nose, fatigue, sinus pressure, headache, mild sore throat maybe 1-2. Thick yellow mucus.   Denies  fever.  Symptoms started on Sunday January 11th (day 3 of symptom(s) today). Worsened first 2 days- mildly better today.  - has tried Nyquil and then mucinex DM in daytime.  -hot tea helps some too -no cigars during this A/P: This looks like a virus- seems to be centering in your sinuses. This can easily tip into bacterial sinusitis and I nee dyou to Call me or mychart me if symptom(s) worsen (since improving today) or if they fail to improve by the 10th- I would send in Augmentin which is first line antibiotic -in the meantime can try sinus rinse like neil medication(s) sinus rinse - tylenol  may help with pressure -avoid decongestants because of blood pressure  -flu and COVID negative and exam doesn't look like strep   # Diabetes S: Medication:Metformin  500 mg daily- takes b12 while on this  Lab Results  Component Value Date   HGBA1C 5.9 (H) 10/01/2024   HGBA1C 6.7 (H) 07/02/2024   HGBA1C 6.6 (H) 01/23/2024  A/P: Vertigo controlled too soon for repeat-continue current medication   #hypertension S: medication: Amlodipine  10 mg, chlorthalidone  25 mg BP Readings from Last 3 Encounters:  01/05/25 132/78  10/13/24 136/81  10/01/24 (!) 144/91  A/P: Blood pressure well-controlled-thankfully he knows to avoid decongestants already.  Asks about Aleve or Advil and discussed may help sinus pressure but will need to watch pressure closely-in general would avoid NSAIDs including etodolac  previously prescribed but he has used exceedingly sparingly  #cigars- sparing use- would advise full cessation but thankfully not using at the moment    Recommended follow up: Return for as needed for new, worsening, persistent symptoms. No future appointments.  Lab/Order associations:   ICD-10-CM   1. Acute cough  R05.1 POC COVID-19    POCT Influenza A/B    2. Viral sinusitis  J32.9    B97.89     3. Primary hypertension  I10     4. Type 2 diabetes mellitus without complications, unspecified whether  long term insulin  use (HCC)  E11.9      Return precautions advised.  Garnette Lukes, MD  "

## 2025-01-05 NOTE — Patient Instructions (Addendum)
 This looks like a virus- seems to be centering in your sinuses. This can easily tip into bacterial sinusitis and I nee dyou to Call me or mychart me if symptom(s) worsen (since improving today) or if they fail to improve by the 10th- I would send in Augmentin which is first line antibiotic -in the meantime can try sinus rinse like neil medication(s) sinus rinse - tylenol  may help with pressure -avoid decongestants because of blood pressure  -flu and COVID negative and exam doesn't look like strep  Recommended follow up: Return for as needed for new, worsening, persistent symptoms.
